# Patient Record
Sex: Female | Born: 1975
Health system: Southern US, Community
[De-identification: ages and names within clinical notes are randomized; demographics above are authoritative.]

## PROBLEM LIST (undated history)

## (undated) ENCOUNTER — Inpatient Hospital Stay (HOSPITAL_COMMUNITY): Payer: Self-pay

## (undated) DIAGNOSIS — I10 Essential (primary) hypertension: Secondary | ICD-10-CM

## (undated) DIAGNOSIS — D6859 Other primary thrombophilia: Secondary | ICD-10-CM

## (undated) HISTORY — PX: NO PAST SURGERIES: SHX2092

---

## 1997-07-28 ENCOUNTER — Other Ambulatory Visit: Admission: RE | Admit: 1997-07-28 | Discharge: 1997-07-28 | Payer: Self-pay | Admitting: Obstetrics & Gynecology

## 1997-11-14 ENCOUNTER — Ambulatory Visit (HOSPITAL_COMMUNITY): Admission: RE | Admit: 1997-11-14 | Discharge: 1997-11-14 | Payer: Self-pay | Admitting: Obstetrics & Gynecology

## 1997-12-23 ENCOUNTER — Ambulatory Visit (HOSPITAL_COMMUNITY): Admission: RE | Admit: 1997-12-23 | Discharge: 1997-12-23 | Payer: Self-pay | Admitting: Obstetrics & Gynecology

## 1998-02-24 ENCOUNTER — Inpatient Hospital Stay (HOSPITAL_COMMUNITY): Admission: AD | Admit: 1998-02-24 | Discharge: 1998-02-26 | Payer: Self-pay

## 1998-10-11 ENCOUNTER — Inpatient Hospital Stay (HOSPITAL_COMMUNITY): Admission: AD | Admit: 1998-10-11 | Discharge: 1998-10-11 | Payer: Self-pay | Admitting: *Deleted

## 1998-10-19 ENCOUNTER — Other Ambulatory Visit: Admission: RE | Admit: 1998-10-19 | Discharge: 1998-10-19 | Payer: Self-pay | Admitting: *Deleted

## 1999-04-01 ENCOUNTER — Inpatient Hospital Stay (HOSPITAL_COMMUNITY): Admission: AD | Admit: 1999-04-01 | Discharge: 1999-04-01 | Payer: Self-pay | Admitting: Obstetrics and Gynecology

## 1999-04-09 ENCOUNTER — Inpatient Hospital Stay (HOSPITAL_COMMUNITY): Admission: AD | Admit: 1999-04-09 | Discharge: 1999-04-11 | Payer: Self-pay | Admitting: *Deleted

## 1999-08-16 ENCOUNTER — Emergency Department (HOSPITAL_COMMUNITY): Admission: EM | Admit: 1999-08-16 | Discharge: 1999-08-16 | Payer: Self-pay | Admitting: Emergency Medicine

## 1999-08-17 ENCOUNTER — Encounter: Payer: Self-pay | Admitting: Emergency Medicine

## 2007-10-20 ENCOUNTER — Encounter: Admission: RE | Admit: 2007-10-20 | Discharge: 2007-12-15 | Payer: Self-pay | Admitting: Obstetrics

## 2007-12-01 ENCOUNTER — Ambulatory Visit (HOSPITAL_COMMUNITY): Admission: RE | Admit: 2007-12-01 | Discharge: 2007-12-01 | Payer: Self-pay | Admitting: Obstetrics

## 2007-12-31 ENCOUNTER — Ambulatory Visit (HOSPITAL_COMMUNITY): Admission: RE | Admit: 2007-12-31 | Discharge: 2007-12-31 | Payer: Self-pay | Admitting: Obstetrics

## 2008-01-28 ENCOUNTER — Ambulatory Visit (HOSPITAL_COMMUNITY): Admission: RE | Admit: 2008-01-28 | Discharge: 2008-01-28 | Payer: Self-pay | Admitting: Obstetrics

## 2008-02-11 ENCOUNTER — Ambulatory Visit (HOSPITAL_COMMUNITY): Admission: RE | Admit: 2008-02-11 | Discharge: 2008-02-11 | Payer: Self-pay | Admitting: Obstetrics

## 2008-02-24 ENCOUNTER — Ambulatory Visit (HOSPITAL_COMMUNITY): Admission: RE | Admit: 2008-02-24 | Discharge: 2008-02-24 | Payer: Self-pay | Admitting: Obstetrics

## 2008-03-02 ENCOUNTER — Encounter: Admission: RE | Admit: 2008-03-02 | Discharge: 2008-03-14 | Payer: Self-pay | Admitting: Obstetrics

## 2008-03-04 ENCOUNTER — Inpatient Hospital Stay (HOSPITAL_COMMUNITY): Admission: AD | Admit: 2008-03-04 | Discharge: 2008-03-04 | Payer: Self-pay | Admitting: Obstetrics

## 2008-03-16 ENCOUNTER — Ambulatory Visit (HOSPITAL_COMMUNITY): Admission: RE | Admit: 2008-03-16 | Discharge: 2008-03-16 | Payer: Self-pay | Admitting: Obstetrics

## 2008-04-06 ENCOUNTER — Ambulatory Visit (HOSPITAL_COMMUNITY): Admission: RE | Admit: 2008-04-06 | Discharge: 2008-04-06 | Payer: Self-pay | Admitting: Obstetrics

## 2008-04-09 ENCOUNTER — Inpatient Hospital Stay (HOSPITAL_COMMUNITY): Admission: AD | Admit: 2008-04-09 | Discharge: 2008-04-09 | Payer: Self-pay | Admitting: Obstetrics and Gynecology

## 2008-04-12 ENCOUNTER — Inpatient Hospital Stay (HOSPITAL_COMMUNITY): Admission: AD | Admit: 2008-04-12 | Discharge: 2008-04-12 | Payer: Self-pay | Admitting: Obstetrics and Gynecology

## 2008-04-21 ENCOUNTER — Inpatient Hospital Stay (HOSPITAL_COMMUNITY): Admission: AD | Admit: 2008-04-21 | Discharge: 2008-04-21 | Payer: Self-pay | Admitting: Obstetrics and Gynecology

## 2008-04-29 ENCOUNTER — Inpatient Hospital Stay (HOSPITAL_COMMUNITY): Admission: AD | Admit: 2008-04-29 | Discharge: 2008-05-02 | Payer: Self-pay | Admitting: Obstetrics & Gynecology

## 2008-05-12 ENCOUNTER — Encounter: Admission: RE | Admit: 2008-05-12 | Discharge: 2008-06-02 | Payer: Self-pay | Admitting: Certified Nurse Midwife

## 2010-02-26 ENCOUNTER — Ambulatory Visit (HOSPITAL_COMMUNITY)
Admission: RE | Admit: 2010-02-26 | Discharge: 2010-02-26 | Payer: Self-pay | Source: Home / Self Care | Admitting: Obstetrics and Gynecology

## 2010-04-30 ENCOUNTER — Ambulatory Visit: Payer: 59 | Attending: Obstetrics and Gynecology | Admitting: Physical Therapy

## 2010-04-30 DIAGNOSIS — M25569 Pain in unspecified knee: Secondary | ICD-10-CM | POA: Insufficient documentation

## 2010-04-30 DIAGNOSIS — IMO0001 Reserved for inherently not codable concepts without codable children: Secondary | ICD-10-CM | POA: Insufficient documentation

## 2010-04-30 DIAGNOSIS — M25669 Stiffness of unspecified knee, not elsewhere classified: Secondary | ICD-10-CM | POA: Insufficient documentation

## 2010-05-02 ENCOUNTER — Ambulatory Visit: Payer: 59 | Admitting: Physical Therapy

## 2010-05-10 ENCOUNTER — Ambulatory Visit: Payer: 59 | Admitting: Rehabilitative and Restorative Service Providers"

## 2010-05-16 ENCOUNTER — Encounter: Payer: 59 | Admitting: Rehabilitative and Restorative Service Providers"

## 2010-06-13 ENCOUNTER — Telehealth: Payer: Self-pay | Admitting: *Deleted

## 2010-06-18 ENCOUNTER — Ambulatory Visit (HOSPITAL_COMMUNITY): Payer: 59 | Attending: Obstetrics & Gynecology | Admitting: Radiology

## 2010-06-18 DIAGNOSIS — R0602 Shortness of breath: Secondary | ICD-10-CM | POA: Insufficient documentation

## 2010-06-18 DIAGNOSIS — O99891 Other specified diseases and conditions complicating pregnancy: Secondary | ICD-10-CM | POA: Insufficient documentation

## 2010-06-18 DIAGNOSIS — R609 Edema, unspecified: Secondary | ICD-10-CM | POA: Insufficient documentation

## 2010-06-18 DIAGNOSIS — I428 Other cardiomyopathies: Secondary | ICD-10-CM

## 2010-06-20 ENCOUNTER — Inpatient Hospital Stay (HOSPITAL_COMMUNITY)
Admission: AD | Admit: 2010-06-20 | Discharge: 2010-06-25 | DRG: 774 | Disposition: A | Discharge status: Home / Self Care | Payer: 59 | Source: Ambulatory Visit | Attending: Obstetrics and Gynecology | Admitting: Obstetrics and Gynecology

## 2010-06-20 DIAGNOSIS — O151 Eclampsia in labor: Principal | ICD-10-CM | POA: Diagnosis present

## 2010-06-20 LAB — COMPREHENSIVE METABOLIC PANEL
ALT: 18 U/L (ref 0–35)
AST: 24 U/L (ref 0–37)
Albumin: 2.4 g/dL — ABNORMAL LOW (ref 3.5–5.2)
Alkaline Phosphatase: 90 U/L (ref 39–117)
BUN: 5 mg/dL — ABNORMAL LOW (ref 6–23)
CO2: 22 mEq/L (ref 19–32)
Calcium: 8.4 mg/dL (ref 8.4–10.5)
Chloride: 107 mEq/L (ref 96–112)
Creatinine, Ser: 0.56 mg/dL (ref 0.4–1.2)
GFR calc Af Amer: >60 mL/min (ref >60)
GFR calc non Af Amer: >60 mL/min (ref >60)
Glucose, Bld: 107 mg/dL — ABNORMAL HIGH (ref 70–99)
Potassium: 3.5 mEq/L (ref 3.5–5.1)
Sodium: 138 mEq/L (ref 135–145)
Total Bilirubin: 0.3 mg/dL (ref 0.3–1.2)
Total Protein: 5.7 g/dL — ABNORMAL LOW (ref 6.0–8.3)

## 2010-06-20 LAB — CBC
HCT: 34.5 % — ABNORMAL LOW (ref 36.0–46.0)
HCT: 34.6 % — ABNORMAL LOW (ref 36.0–46.0)
Hemoglobin: 11.2 g/dL — ABNORMAL LOW (ref 12.0–15.0)
Hemoglobin: 11.5 g/dL — ABNORMAL LOW (ref 12.0–15.0)
MCH: 27.1 pg (ref 26.0–34.0)
MCH: 27.7 pg (ref 26.0–34.0)
MCHC: 32.4 g/dL (ref 30.0–36.0)
MCHC: 33.3 g/dL (ref 30.0–36.0)
MCV: 83.1 fL (ref 78.0–100.0)
MCV: 83.8 fL (ref 78.0–100.0)
Platelets: 326 10*3/uL (ref 150–400)
Platelets: 327 10*3/uL (ref 150–400)
RBC: 4.13 MIL/uL (ref 3.87–5.11)
RBC: 4.15 MIL/uL (ref 3.87–5.11)
RDW: 14.3 % (ref 11.5–15.5)
RDW: 14.7 % (ref 11.5–15.5)
WBC: 16.5 10*3/uL — ABNORMAL HIGH (ref 4.0–10.5)
WBC: 9.2 10*3/uL (ref 4.0–10.5)

## 2010-06-20 LAB — URIC ACID: Uric Acid, Serum: 7.7 mg/dL — ABNORMAL HIGH (ref 2.4–7.0)

## 2010-06-20 LAB — RPR: RPR Ser Ql: NONREACTIVE

## 2010-06-20 LAB — LACTATE DEHYDROGENASE: LDH: 187 U/L (ref 94–250)

## 2010-06-20 LAB — MAGNESIUM: Magnesium: 3.7 mg/dL — ABNORMAL HIGH (ref 1.5–2.5)

## 2010-06-21 LAB — COMPREHENSIVE METABOLIC PANEL
ALT: 18 U/L (ref 0–35)
AST: 29 U/L (ref 0–37)
Albumin: 2.1 g/dL — ABNORMAL LOW (ref 3.5–5.2)
Alkaline Phosphatase: 81 U/L (ref 39–117)
BUN: 3 mg/dL — ABNORMAL LOW (ref 6–23)
CO2: 24 mEq/L (ref 19–32)
Calcium: 7.6 mg/dL — ABNORMAL LOW (ref 8.4–10.5)
Chloride: 105 mEq/L (ref 96–112)
Creatinine, Ser: 0.62 mg/dL (ref 0.4–1.2)
GFR calc Af Amer: >60 mL/min (ref >60)
GFR calc non Af Amer: >60 mL/min (ref >60)
Glucose, Bld: 127 mg/dL — ABNORMAL HIGH (ref 70–99)
Potassium: 2.8 mEq/L — ABNORMAL LOW (ref 3.5–5.1)
Sodium: 137 mEq/L (ref 135–145)
Total Bilirubin: 0.5 mg/dL (ref 0.3–1.2)
Total Protein: 5.8 g/dL — ABNORMAL LOW (ref 6.0–8.3)

## 2010-06-21 LAB — CBC
HCT: 33 % — ABNORMAL LOW (ref 36.0–46.0)
Hemoglobin: 10.7 g/dL — ABNORMAL LOW (ref 12.0–15.0)
MCH: 27.2 pg (ref 26.0–34.0)
MCHC: 32.4 g/dL (ref 30.0–36.0)
MCV: 84 fL (ref 78.0–100.0)
Platelets: 314 10*3/uL (ref 150–400)
RBC: 3.93 MIL/uL (ref 3.87–5.11)
RDW: 14.7 % (ref 11.5–15.5)
WBC: 20.4 10*3/uL — ABNORMAL HIGH (ref 4.0–10.5)

## 2010-06-21 LAB — URIC ACID: Uric Acid, Serum: 8.2 mg/dL — ABNORMAL HIGH (ref 2.4–7.0)

## 2010-06-21 LAB — LACTATE DEHYDROGENASE: LDH: 219 U/L (ref 94–250)

## 2010-06-21 LAB — MRSA PCR SCREENING

## 2010-06-21 LAB — MAGNESIUM: Magnesium: 4.9 mg/dL — ABNORMAL HIGH (ref 1.5–2.5)

## 2010-06-22 LAB — MAGNESIUM: Magnesium: 4.7 mg/dL — ABNORMAL HIGH (ref 1.5–2.5)

## 2010-06-22 LAB — COMPREHENSIVE METABOLIC PANEL
ALT: 18 U/L (ref 0–35)
AST: 28 U/L (ref 0–37)
Albumin: 2 g/dL — ABNORMAL LOW (ref 3.5–5.2)
Alkaline Phosphatase: 68 U/L (ref 39–117)
BUN: 3 mg/dL — ABNORMAL LOW (ref 6–23)
CO2: 25 mEq/L (ref 19–32)
Calcium: 7 mg/dL — ABNORMAL LOW (ref 8.4–10.5)
Chloride: 109 mEq/L (ref 96–112)
Creatinine, Ser: 0.6 mg/dL (ref 0.4–1.2)
GFR calc Af Amer: >60 mL/min (ref >60)
GFR calc non Af Amer: >60 mL/min (ref >60)
Glucose, Bld: 115 mg/dL — ABNORMAL HIGH (ref 70–99)
Potassium: 3.1 mEq/L — ABNORMAL LOW (ref 3.5–5.1)
Sodium: 140 mEq/L (ref 135–145)
Total Bilirubin: 0.4 mg/dL (ref 0.3–1.2)
Total Protein: 5.4 g/dL — ABNORMAL LOW (ref 6.0–8.3)

## 2010-06-22 LAB — DIFFERENTIAL
Basophils Absolute: 0 10*3/uL (ref 0.0–0.1)
Basophils Relative: 0 % (ref 0–1)
Eosinophils Absolute: 0.3 10*3/uL (ref 0.0–0.7)
Eosinophils Relative: 2 % (ref 0–5)
Lymphocytes Relative: 28 % (ref 12–46)
Lymphs Abs: 3.8 10*3/uL (ref 0.7–4.0)
Monocytes Absolute: 1.5 10*3/uL — ABNORMAL HIGH (ref 0.1–1.0)
Monocytes Relative: 11 % (ref 3–12)
Neutro Abs: 8.1 10*3/uL — ABNORMAL HIGH (ref 1.7–7.7)
Neutrophils Relative %: 59 % (ref 43–77)

## 2010-06-22 LAB — CBC
HCT: 31.2 % — ABNORMAL LOW (ref 36.0–46.0)
Hemoglobin: 9.7 g/dL — ABNORMAL LOW (ref 12.0–15.0)
MCH: 26.5 pg (ref 26.0–34.0)
MCHC: 31.1 g/dL (ref 30.0–36.0)
MCV: 85.2 fL (ref 78.0–100.0)
Platelets: 308 10*3/uL (ref 150–400)
RBC: 3.66 MIL/uL — ABNORMAL LOW (ref 3.87–5.11)
RDW: 15.1 % (ref 11.5–15.5)
WBC: 13.7 10*3/uL — ABNORMAL HIGH (ref 4.0–10.5)

## 2010-06-22 LAB — T4, FREE: Free T4: 0.97 ng/dL (ref 0.80–1.80)

## 2010-06-22 LAB — TSH: TSH: 2.917 u[IU]/mL (ref 0.350–4.500)

## 2010-06-23 LAB — COMPREHENSIVE METABOLIC PANEL
ALT: 42 U/L — ABNORMAL HIGH (ref 0–35)
AST: 63 U/L — ABNORMAL HIGH (ref 0–37)
Albumin: 2.3 g/dL — ABNORMAL LOW (ref 3.5–5.2)
Alkaline Phosphatase: 77 U/L (ref 39–117)
BUN: 2 mg/dL — ABNORMAL LOW (ref 6–23)
CO2: 26 mEq/L (ref 19–32)
Calcium: 7.9 mg/dL — ABNORMAL LOW (ref 8.4–10.5)
Chloride: 103 mEq/L (ref 96–112)
Creatinine, Ser: 0.59 mg/dL (ref 0.4–1.2)
GFR calc Af Amer: >60 mL/min (ref >60)
GFR calc non Af Amer: >60 mL/min (ref >60)
Glucose, Bld: 77 mg/dL (ref 70–99)
Potassium: 3.6 mEq/L (ref 3.5–5.1)
Sodium: 139 mEq/L (ref 135–145)
Total Bilirubin: 0.2 mg/dL — ABNORMAL LOW (ref 0.3–1.2)
Total Protein: 5.2 g/dL — ABNORMAL LOW (ref 6.0–8.3)

## 2010-06-23 LAB — MRSA CULTURE

## 2010-06-24 LAB — COMPREHENSIVE METABOLIC PANEL
ALT: 52 U/L — ABNORMAL HIGH (ref 0–35)
AST: 64 U/L — ABNORMAL HIGH (ref 0–37)
Albumin: 2.2 g/dL — ABNORMAL LOW (ref 3.5–5.2)
Alkaline Phosphatase: 71 U/L (ref 39–117)
BUN: 3 mg/dL — ABNORMAL LOW (ref 6–23)
CO2: 26 mEq/L (ref 19–32)
Calcium: 7.9 mg/dL — ABNORMAL LOW (ref 8.4–10.5)
Chloride: 105 mEq/L (ref 96–112)
Creatinine, Ser: 0.59 mg/dL (ref 0.4–1.2)
GFR calc Af Amer: >60 mL/min (ref >60)
GFR calc non Af Amer: >60 mL/min (ref >60)
Glucose, Bld: 85 mg/dL (ref 70–99)
Potassium: 3.4 mEq/L — ABNORMAL LOW (ref 3.5–5.1)
Sodium: 139 mEq/L (ref 135–145)
Total Bilirubin: 0.6 mg/dL (ref 0.3–1.2)
Total Protein: 5.8 g/dL — ABNORMAL LOW (ref 6.0–8.3)

## 2010-06-24 LAB — CBC
HCT: 31.8 % — ABNORMAL LOW (ref 36.0–46.0)
Hemoglobin: 10 g/dL — ABNORMAL LOW (ref 12.0–15.0)
MCH: 26.9 pg (ref 26.0–34.0)
MCHC: 31.4 g/dL (ref 30.0–36.0)
MCV: 85.5 fL (ref 78.0–100.0)
Platelets: 343 10*3/uL (ref 150–400)
RBC: 3.72 MIL/uL — ABNORMAL LOW (ref 3.87–5.11)
RDW: 15.2 % (ref 11.5–15.5)
WBC: 9.5 10*3/uL (ref 4.0–10.5)

## 2010-06-25 LAB — COMPREHENSIVE METABOLIC PANEL
ALT: 49 U/L — ABNORMAL HIGH (ref 0–35)
AST: 42 U/L — ABNORMAL HIGH (ref 0–37)
Albumin: 2.5 g/dL — ABNORMAL LOW (ref 3.5–5.2)
Alkaline Phosphatase: 77 U/L (ref 39–117)
BUN: 3 mg/dL — ABNORMAL LOW (ref 6–23)
CO2: 25 mEq/L (ref 19–32)
Calcium: 8.6 mg/dL (ref 8.4–10.5)
Chloride: 108 mEq/L (ref 96–112)
Creatinine, Ser: 0.5 mg/dL (ref 0.4–1.2)
GFR calc Af Amer: >60 mL/min (ref >60)
GFR calc non Af Amer: >60 mL/min (ref >60)
Glucose, Bld: 99 mg/dL (ref 70–99)
Potassium: 3.6 mEq/L (ref 3.5–5.1)
Sodium: 139 mEq/L (ref 135–145)
Total Bilirubin: 0.7 mg/dL (ref 0.3–1.2)
Total Protein: 6.4 g/dL (ref 6.0–8.3)

## 2010-06-25 LAB — CBC
HCT: 33.6 % — ABNORMAL LOW (ref 36.0–46.0)
Hemoglobin: 10.7 g/dL — ABNORMAL LOW (ref 12.0–15.0)
MCH: 27.2 pg (ref 26.0–34.0)
MCHC: 31.8 g/dL (ref 30.0–36.0)
MCV: 85.5 fL (ref 78.0–100.0)
Platelets: 360 10*3/uL (ref 150–400)
RBC: 3.93 MIL/uL (ref 3.87–5.11)
RDW: 15.2 % (ref 11.5–15.5)
WBC: 9 10*3/uL (ref 4.0–10.5)

## 2010-06-26 ENCOUNTER — Encounter (HOSPITAL_COMMUNITY)
Admission: RE | Admit: 2010-06-26 | Discharge: 2010-06-26 | Disposition: A | Discharge status: Home / Self Care | Payer: 59 | Source: Ambulatory Visit | Attending: Obstetrics and Gynecology | Admitting: Obstetrics and Gynecology

## 2010-06-26 DIAGNOSIS — O923 Agalactia: Secondary | ICD-10-CM | POA: Insufficient documentation

## 2010-06-29 NOTE — Telephone Encounter (Signed)
Pt had echo 06/18/10

## 2010-07-09 LAB — BASIC METABOLIC PANEL
BUN: 3 mg/dL — ABNORMAL LOW (ref 6–23)
CO2: 23 mEq/L (ref 19–32)
Calcium: 8.6 mg/dL (ref 8.4–10.5)
Chloride: 108 mEq/L (ref 96–112)
Creatinine, Ser: 0.38 mg/dL — ABNORMAL LOW (ref 0.4–1.2)
GFR calc Af Amer: >60 mL/min (ref >60)
GFR calc non Af Amer: >60 mL/min (ref >60)
Glucose, Bld: 81 mg/dL (ref 70–99)
Potassium: 3.1 mEq/L — ABNORMAL LOW (ref 3.5–5.1)
Sodium: 137 mEq/L (ref 135–145)

## 2010-07-09 LAB — GLUCOSE, CAPILLARY
Glucose-Capillary: 79 mg/dL (ref 70–99)
Glucose-Capillary: 80 mg/dL (ref 70–99)

## 2010-07-10 LAB — GLUCOSE, CAPILLARY
Glucose-Capillary: 106 mg/dL — ABNORMAL HIGH (ref 70–99)
Glucose-Capillary: 109 mg/dL — ABNORMAL HIGH (ref 70–99)
Glucose-Capillary: 117 mg/dL — ABNORMAL HIGH (ref 70–99)
Glucose-Capillary: 72 mg/dL (ref 70–99)
Glucose-Capillary: 76 mg/dL (ref 70–99)
Glucose-Capillary: 77 mg/dL (ref 70–99)
Glucose-Capillary: 81 mg/dL (ref 70–99)
Glucose-Capillary: 95 mg/dL (ref 70–99)

## 2010-07-10 LAB — CBC
HCT: 23.5 % — ABNORMAL LOW (ref 36.0–46.0)
HCT: 26.8 % — ABNORMAL LOW (ref 36.0–46.0)
HCT: 32.8 % — ABNORMAL LOW (ref 36.0–46.0)
Hemoglobin: 10.7 g/dL — ABNORMAL LOW (ref 12.0–15.0)
Hemoglobin: 7.7 g/dL — CL (ref 12.0–15.0)
Hemoglobin: 8.8 g/dL — ABNORMAL LOW (ref 12.0–15.0)
MCHC: 32.5 g/dL (ref 30.0–36.0)
MCHC: 32.8 g/dL (ref 30.0–36.0)
MCHC: 32.9 g/dL (ref 30.0–36.0)
MCV: 86.6 fL (ref 78.0–100.0)
MCV: 87 fL (ref 78.0–100.0)
MCV: 87 fL (ref 78.0–100.0)
Platelets: 247 10*3/uL (ref 150–400)
Platelets: 263 10*3/uL (ref 150–400)
Platelets: 295 10*3/uL (ref 150–400)
RBC: 2.7 MIL/uL — ABNORMAL LOW (ref 3.87–5.11)
RBC: 3.08 MIL/uL — ABNORMAL LOW (ref 3.87–5.11)
RBC: 3.79 MIL/uL — ABNORMAL LOW (ref 3.87–5.11)
RDW: 14 % (ref 11.5–15.5)
RDW: 14.2 % (ref 11.5–15.5)
RDW: 14.5 % (ref 11.5–15.5)
WBC: 13.5 10*3/uL — ABNORMAL HIGH (ref 4.0–10.5)
WBC: 15.6 10*3/uL — ABNORMAL HIGH (ref 4.0–10.5)
WBC: 9.8 10*3/uL (ref 4.0–10.5)

## 2010-07-10 LAB — RPR: RPR Ser Ql: NONREACTIVE

## 2010-07-18 ENCOUNTER — Inpatient Hospital Stay (HOSPITAL_COMMUNITY)
Admission: AD | Admit: 2010-07-18 | Discharge: 2010-07-18 | Payer: Self-pay | Attending: Obstetrics and Gynecology | Admitting: Obstetrics and Gynecology

## 2010-07-26 ENCOUNTER — Encounter (HOSPITAL_COMMUNITY)
Admission: RE | Admit: 2010-07-26 | Discharge: 2010-07-26 | Disposition: A | Discharge status: Home / Self Care | Payer: 59 | Source: Ambulatory Visit | Attending: Obstetrics and Gynecology | Admitting: Obstetrics and Gynecology

## 2010-07-26 DIAGNOSIS — O923 Agalactia: Secondary | ICD-10-CM | POA: Insufficient documentation

## 2010-08-07 ENCOUNTER — Ambulatory Visit: Payer: 59

## 2010-08-07 ENCOUNTER — Other Ambulatory Visit: Payer: Self-pay | Admitting: Pediatrics

## 2010-08-07 DIAGNOSIS — R109 Unspecified abdominal pain: Secondary | ICD-10-CM

## 2010-08-09 IMAGING — US US OB FOLLOW-UP
1 series · 14 of 28 positions shown · non-contrast
Comparison: none

OBSTETRICAL ULTRASOUND:
 This ultrasound was performed in The [HOSPITAL], and the [facility] report will be stored to [REDACTED] PACS.

[Series 1: us ob follow-up · 14 of 43 slices shown]
[im 2/43]
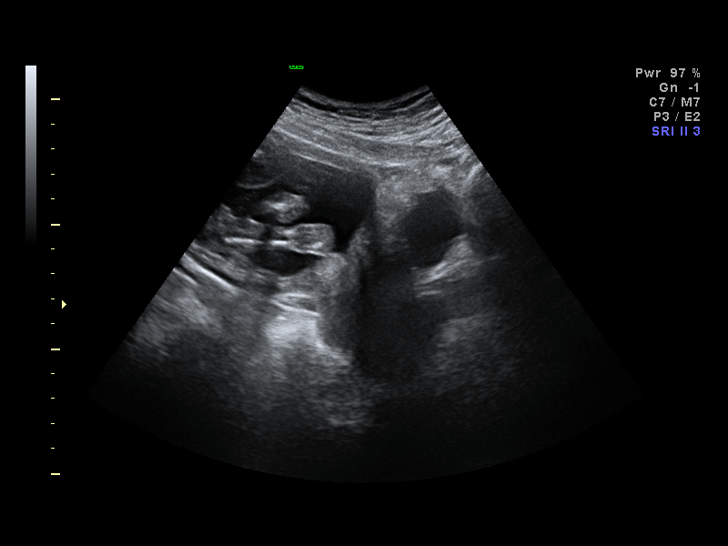
[im 5/43]
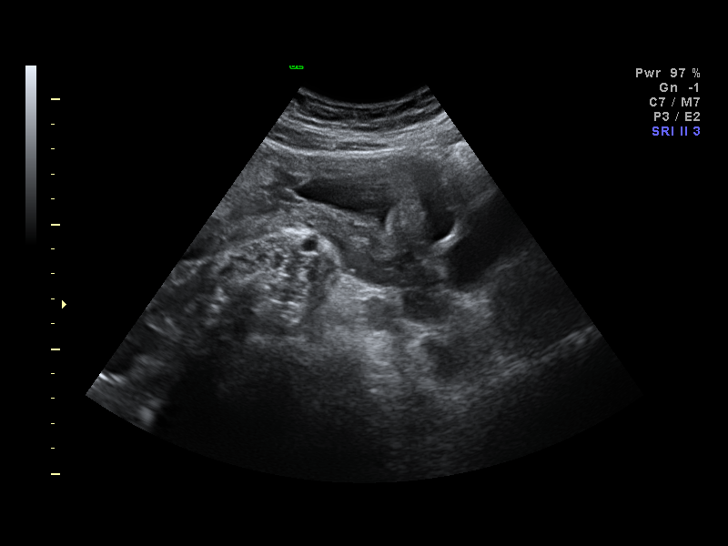
[im 8/43]
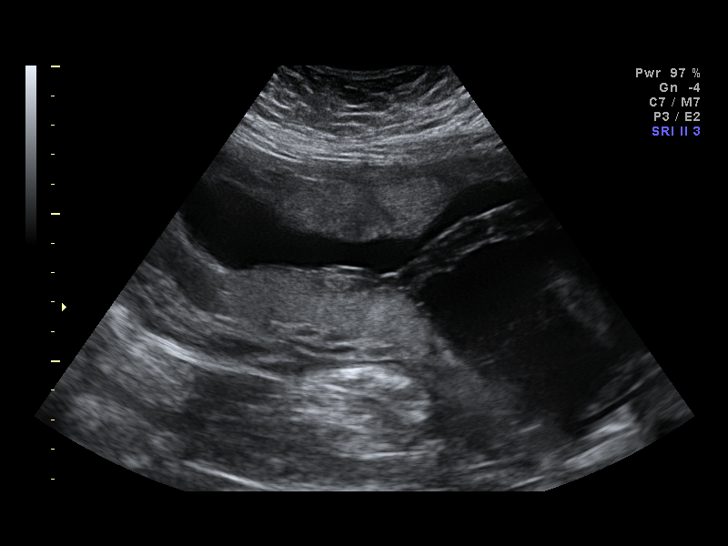
[im 11/43]
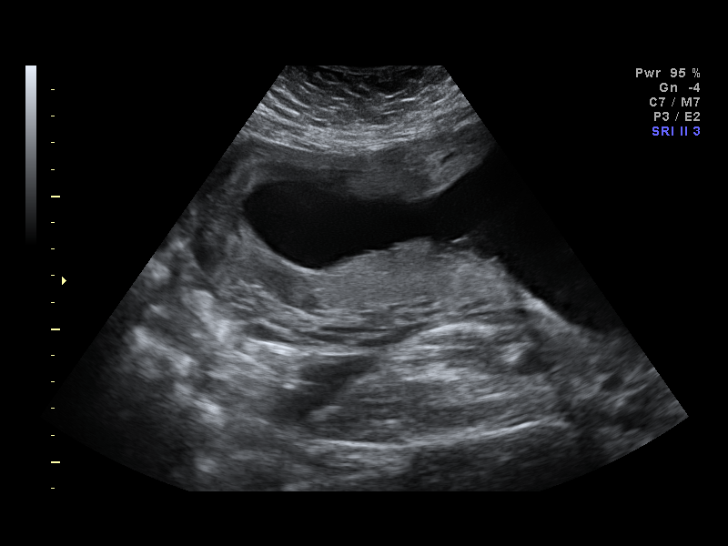
[im 15/43]
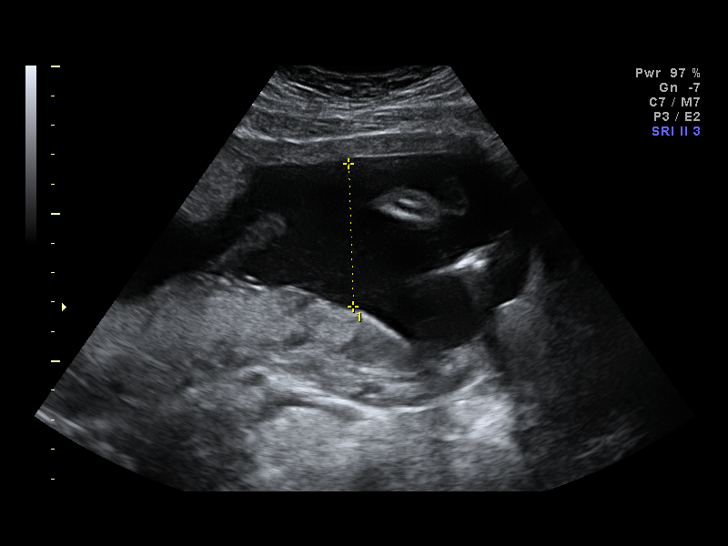
[im 18/43]
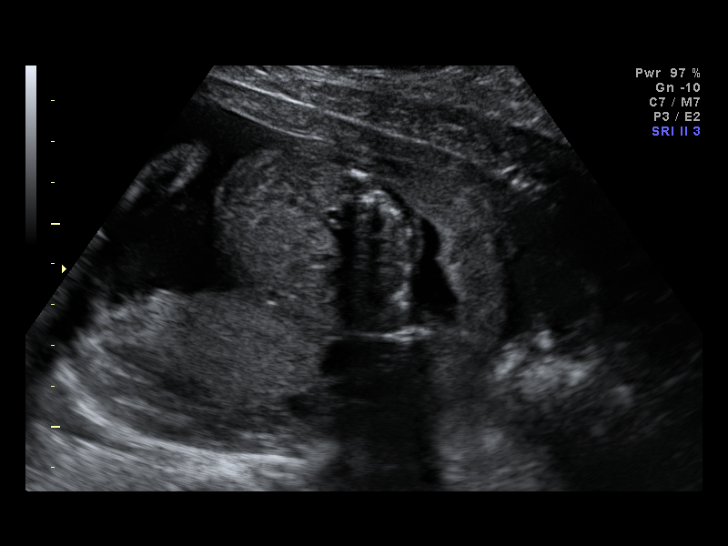
[im 21/43]
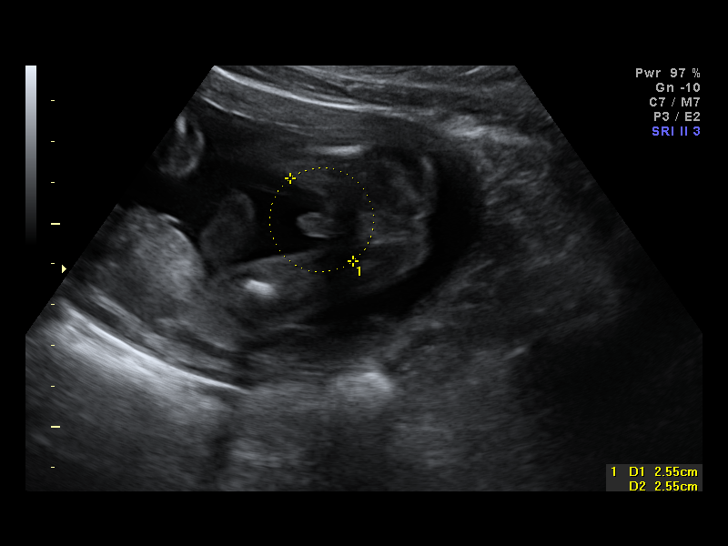
[im 24/43]
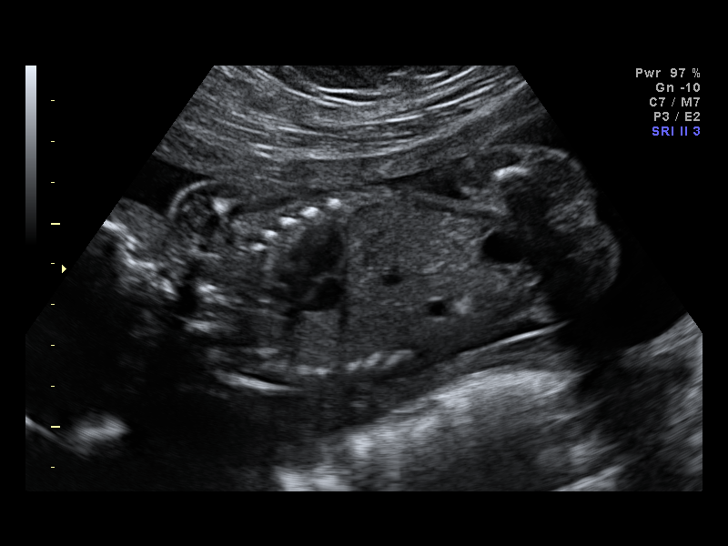
[im 27/43]
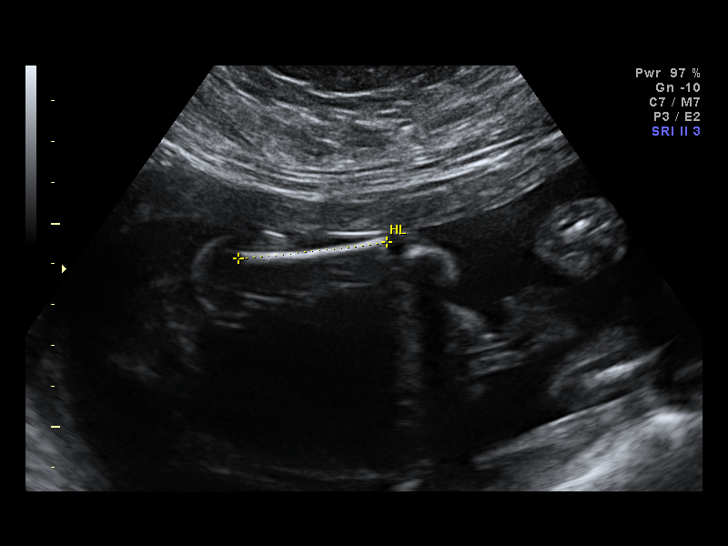
[im 30/43]
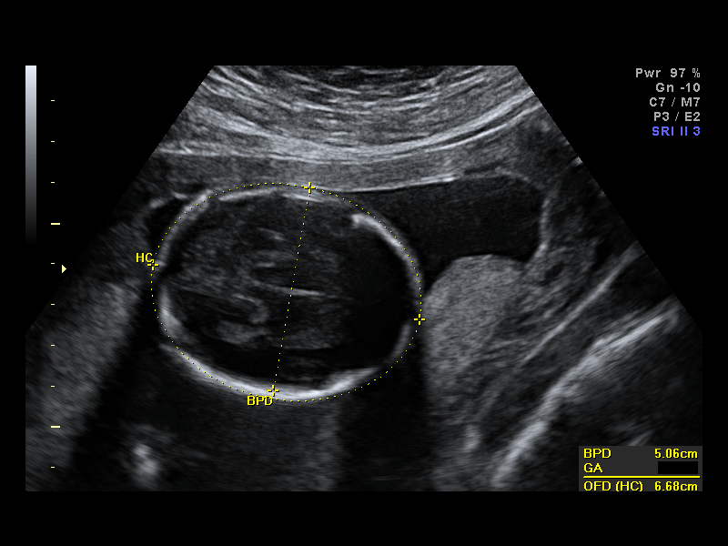
[im 33/43]
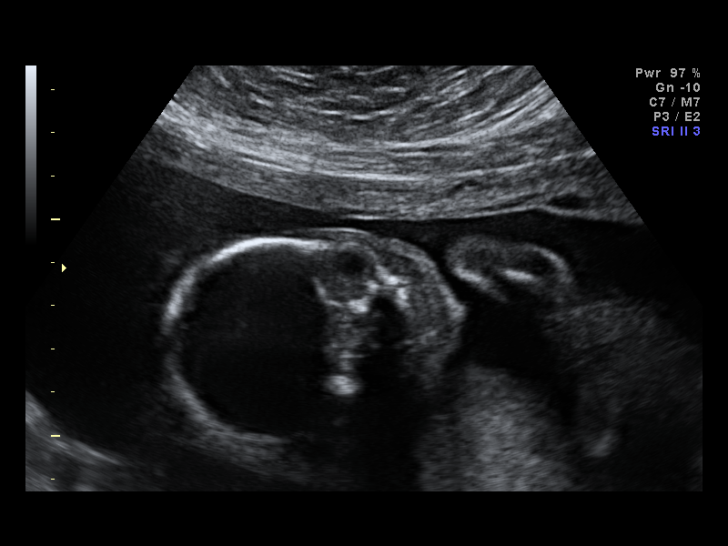
[im 36/43]
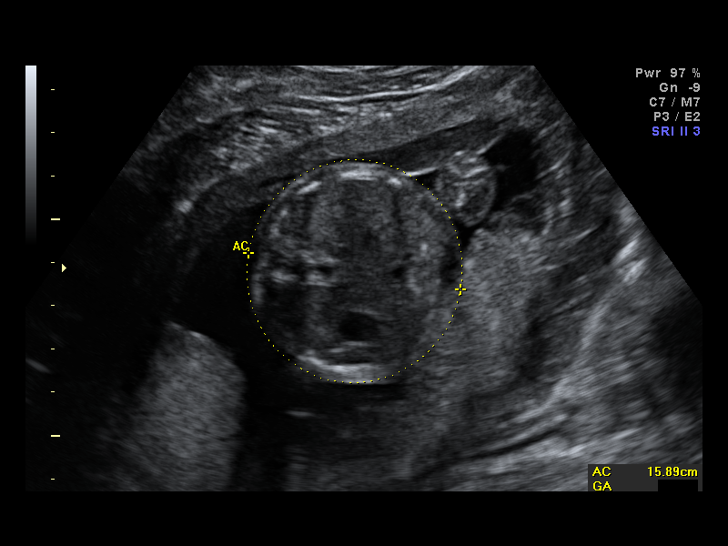
[im 39/43]
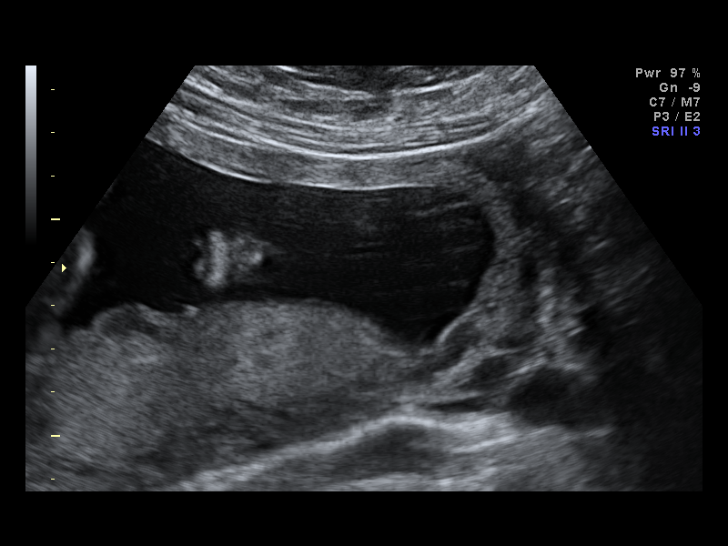
[im 43/43]
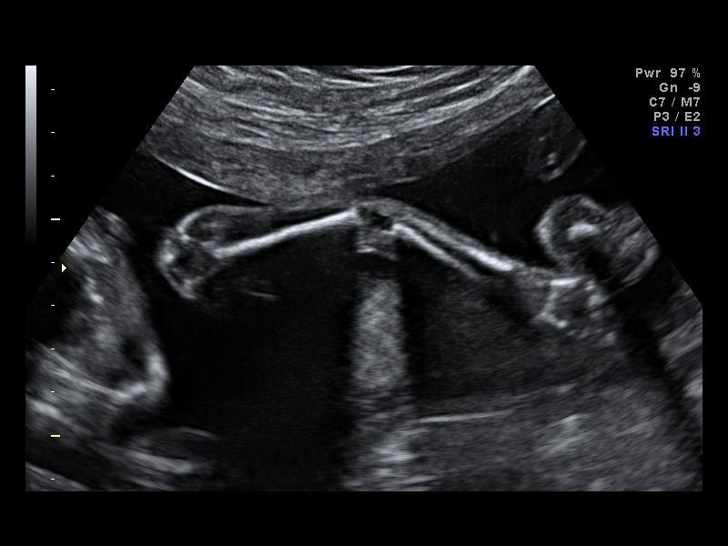

[14 of 28 positions shown; findings below may reference images not displayed]

IMPRESSION: [facility] has also been faxed to the ordering physician.
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
## 2010-08-26 ENCOUNTER — Encounter (HOSPITAL_COMMUNITY)
Admission: RE | Admit: 2010-08-26 | Discharge: 2010-08-26 | Disposition: A | Discharge status: Home / Self Care | Payer: 59 | Source: Ambulatory Visit | Attending: Obstetrics and Gynecology | Admitting: Obstetrics and Gynecology

## 2010-08-26 DIAGNOSIS — O923 Agalactia: Secondary | ICD-10-CM | POA: Insufficient documentation

## 2010-09-27 ENCOUNTER — Encounter (HOSPITAL_COMMUNITY)
Admission: RE | Admit: 2010-09-27 | Discharge: 2010-09-27 | Disposition: A | Discharge status: Home / Self Care | Payer: 59 | Source: Ambulatory Visit | Attending: Obstetrics and Gynecology | Admitting: Obstetrics and Gynecology

## 2010-09-27 DIAGNOSIS — O923 Agalactia: Secondary | ICD-10-CM | POA: Insufficient documentation

## 2010-10-24 NOTE — Telephone Encounter (Signed)
I Believe this was sent to me by accident.

## 2010-10-28 ENCOUNTER — Encounter (HOSPITAL_COMMUNITY)
Admission: RE | Admit: 2010-10-28 | Discharge: 2010-10-28 | Disposition: A | Discharge status: Home / Self Care | Payer: 59 | Source: Ambulatory Visit | Attending: Obstetrics and Gynecology | Admitting: Obstetrics and Gynecology

## 2010-10-28 DIAGNOSIS — O923 Agalactia: Secondary | ICD-10-CM | POA: Insufficient documentation

## 2010-11-28 ENCOUNTER — Encounter (HOSPITAL_COMMUNITY)
Admission: RE | Admit: 2010-11-28 | Discharge: 2010-11-28 | Disposition: A | Discharge status: Home / Self Care | Payer: 59 | Source: Ambulatory Visit | Attending: Obstetrics and Gynecology | Admitting: Obstetrics and Gynecology

## 2010-11-28 DIAGNOSIS — O923 Agalactia: Secondary | ICD-10-CM | POA: Insufficient documentation

## 2010-12-29 ENCOUNTER — Encounter (HOSPITAL_COMMUNITY)
Admission: RE | Admit: 2010-12-29 | Discharge: 2010-12-29 | Disposition: A | Discharge status: Home / Self Care | Payer: 59 | Source: Ambulatory Visit | Attending: Obstetrics and Gynecology | Admitting: Obstetrics and Gynecology

## 2010-12-29 DIAGNOSIS — O923 Agalactia: Secondary | ICD-10-CM | POA: Insufficient documentation

## 2011-01-29 ENCOUNTER — Encounter (HOSPITAL_COMMUNITY)
Admission: RE | Admit: 2011-01-29 | Discharge: 2011-01-29 | Disposition: A | Discharge status: Home / Self Care | Payer: 59 | Source: Ambulatory Visit | Attending: Obstetrics and Gynecology | Admitting: Obstetrics and Gynecology

## 2011-01-29 DIAGNOSIS — O923 Agalactia: Secondary | ICD-10-CM | POA: Insufficient documentation

## 2011-03-01 ENCOUNTER — Encounter (HOSPITAL_COMMUNITY)
Admission: RE | Admit: 2011-03-01 | Discharge: 2011-03-01 | Disposition: A | Discharge status: Home / Self Care | Payer: 59 | Source: Ambulatory Visit | Attending: Obstetrics and Gynecology | Admitting: Obstetrics and Gynecology

## 2011-03-01 DIAGNOSIS — O923 Agalactia: Secondary | ICD-10-CM | POA: Insufficient documentation

## 2011-04-01 ENCOUNTER — Encounter (HOSPITAL_COMMUNITY)
Admission: RE | Admit: 2011-04-01 | Discharge: 2011-04-01 | Disposition: A | Discharge status: Home / Self Care | Payer: 59 | Source: Ambulatory Visit | Attending: Obstetrics and Gynecology | Admitting: Obstetrics and Gynecology

## 2011-04-01 DIAGNOSIS — O923 Agalactia: Secondary | ICD-10-CM | POA: Insufficient documentation

## 2011-05-02 ENCOUNTER — Encounter (HOSPITAL_COMMUNITY)
Admission: RE | Admit: 2011-05-02 | Discharge: 2011-05-02 | Disposition: A | Discharge status: Home / Self Care | Payer: 59 | Source: Ambulatory Visit | Attending: Obstetrics and Gynecology | Admitting: Obstetrics and Gynecology

## 2011-05-02 DIAGNOSIS — O923 Agalactia: Secondary | ICD-10-CM | POA: Insufficient documentation

## 2011-05-31 ENCOUNTER — Encounter (HOSPITAL_COMMUNITY)
Admission: RE | Admit: 2011-05-31 | Discharge: 2011-05-31 | Disposition: A | Discharge status: Home / Self Care | Payer: 59 | Source: Ambulatory Visit | Attending: Obstetrics and Gynecology | Admitting: Obstetrics and Gynecology

## 2011-05-31 DIAGNOSIS — O923 Agalactia: Secondary | ICD-10-CM | POA: Insufficient documentation

## 2011-07-01 ENCOUNTER — Encounter (HOSPITAL_COMMUNITY)
Admission: RE | Admit: 2011-07-01 | Discharge: 2011-07-01 | Disposition: A | Discharge status: Home / Self Care | Payer: 59 | Source: Ambulatory Visit | Attending: Obstetrics and Gynecology | Admitting: Obstetrics and Gynecology

## 2011-07-01 DIAGNOSIS — O923 Agalactia: Secondary | ICD-10-CM | POA: Insufficient documentation

## 2011-08-01 ENCOUNTER — Encounter (HOSPITAL_COMMUNITY)
Admission: RE | Admit: 2011-08-01 | Discharge: 2011-08-01 | Disposition: A | Discharge status: Home / Self Care | Payer: 59 | Source: Ambulatory Visit | Attending: Obstetrics and Gynecology | Admitting: Obstetrics and Gynecology

## 2011-08-01 DIAGNOSIS — O923 Agalactia: Secondary | ICD-10-CM | POA: Insufficient documentation

## 2011-09-01 ENCOUNTER — Encounter (HOSPITAL_COMMUNITY)
Admission: RE | Admit: 2011-09-01 | Discharge: 2011-09-01 | Disposition: A | Discharge status: Home / Self Care | Payer: 59 | Source: Ambulatory Visit | Attending: Obstetrics and Gynecology | Admitting: Obstetrics and Gynecology

## 2011-09-01 DIAGNOSIS — O923 Agalactia: Secondary | ICD-10-CM | POA: Insufficient documentation

## 2011-10-02 ENCOUNTER — Encounter (HOSPITAL_COMMUNITY)
Admission: RE | Admit: 2011-10-02 | Discharge: 2011-10-02 | Disposition: A | Discharge status: Home / Self Care | Payer: 59 | Source: Ambulatory Visit | Attending: Obstetrics and Gynecology | Admitting: Obstetrics and Gynecology

## 2011-10-02 DIAGNOSIS — O923 Agalactia: Secondary | ICD-10-CM | POA: Insufficient documentation

## 2011-11-02 ENCOUNTER — Encounter (HOSPITAL_COMMUNITY)
Admission: RE | Admit: 2011-11-02 | Discharge: 2011-11-02 | Disposition: A | Discharge status: Home / Self Care | Payer: 59 | Source: Ambulatory Visit | Attending: Obstetrics and Gynecology | Admitting: Obstetrics and Gynecology

## 2011-11-02 DIAGNOSIS — O923 Agalactia: Secondary | ICD-10-CM | POA: Insufficient documentation

## 2011-12-03 ENCOUNTER — Encounter (HOSPITAL_COMMUNITY)
Admission: RE | Admit: 2011-12-03 | Discharge: 2011-12-03 | Disposition: A | Discharge status: Home / Self Care | Payer: 59 | Source: Ambulatory Visit | Attending: Obstetrics and Gynecology | Admitting: Obstetrics and Gynecology

## 2011-12-03 DIAGNOSIS — O923 Agalactia: Secondary | ICD-10-CM | POA: Insufficient documentation

## 2012-01-03 ENCOUNTER — Encounter (HOSPITAL_COMMUNITY)
Admission: RE | Admit: 2012-01-03 | Discharge: 2012-01-03 | Disposition: A | Discharge status: Home / Self Care | Payer: 59 | Source: Ambulatory Visit | Attending: Obstetrics and Gynecology | Admitting: Obstetrics and Gynecology

## 2012-01-03 DIAGNOSIS — O923 Agalactia: Secondary | ICD-10-CM | POA: Insufficient documentation

## 2012-02-03 ENCOUNTER — Encounter (HOSPITAL_COMMUNITY)
Admission: RE | Admit: 2012-02-03 | Discharge: 2012-02-03 | Disposition: A | Discharge status: Home / Self Care | Payer: 59 | Source: Ambulatory Visit | Attending: Obstetrics and Gynecology | Admitting: Obstetrics and Gynecology

## 2012-02-03 DIAGNOSIS — O923 Agalactia: Secondary | ICD-10-CM | POA: Insufficient documentation

## 2012-08-07 ENCOUNTER — Other Ambulatory Visit: Payer: Self-pay | Admitting: Obstetrics and Gynecology

## 2012-08-07 ENCOUNTER — Other Ambulatory Visit (HOSPITAL_COMMUNITY)
Admission: RE | Admit: 2012-08-07 | Discharge: 2012-08-07 | Disposition: A | Discharge status: Home / Self Care | Payer: Managed Care, Other (non HMO) | Source: Ambulatory Visit | Attending: Obstetrics and Gynecology | Admitting: Obstetrics and Gynecology

## 2012-08-07 DIAGNOSIS — Z113 Encounter for screening for infections with a predominantly sexual mode of transmission: Secondary | ICD-10-CM | POA: Insufficient documentation

## 2012-08-07 DIAGNOSIS — Z01419 Encounter for gynecological examination (general) (routine) without abnormal findings: Secondary | ICD-10-CM | POA: Insufficient documentation

## 2012-08-07 DIAGNOSIS — Z1151 Encounter for screening for human papillomavirus (HPV): Secondary | ICD-10-CM | POA: Insufficient documentation

## 2012-10-26 ENCOUNTER — Encounter (HOSPITAL_COMMUNITY): Payer: Self-pay

## 2012-10-26 ENCOUNTER — Emergency Department (HOSPITAL_COMMUNITY)
Admission: EM | Admit: 2012-10-26 | Discharge: 2012-10-27 | Disposition: A | Discharge status: Home / Self Care | Payer: Managed Care, Other (non HMO) | Attending: Emergency Medicine | Admitting: Emergency Medicine

## 2012-10-26 DIAGNOSIS — M545 Low back pain, unspecified: Secondary | ICD-10-CM | POA: Insufficient documentation

## 2012-10-26 DIAGNOSIS — Z3202 Encounter for pregnancy test, result negative: Secondary | ICD-10-CM | POA: Insufficient documentation

## 2012-10-26 LAB — POCT PREGNANCY, URINE: Preg Test, Ur: NEGATIVE

## 2012-10-26 MED ORDER — HYDROCODONE-ACETAMINOPHEN 5-325 MG PO TABS
2.0000 | ORAL_TABLET | Freq: Once | ORAL | Status: AC
  Administered 2012-10-27: 2 via ORAL
  Filled 2012-10-26: qty 2

## 2012-10-26 NOTE — ED Notes (Signed)
Pt complains of acute onset of lower back pain yesterday while riding in the car, no injury noted

## 2012-10-26 NOTE — ED Provider Notes (Signed)
CSN: [id number]     Arrival date & time 10/26/12  2251 History  This chart was scribed for non-physician practitioner, Ivonne Andrew, PA-C working with Raeford Razor, MD by Greggory Stallion, ED scribe. This patient was seen in room WTR7/WTR7 and the patient's care was started at 11:24 PM.   Chief Complaint  Patient presents with  . Back Pain   The history is provided by the patient. No language interpreter was used.    HPI Comments: History provided by patient and significant other. Jean Garcia is a 37 y.o. female with no significant PMH who presents to the Emergency Department complaining of sudden onset, constant lower back pain that started yesterday night while riding in the car. Patient was traveling from [place] back to [place] and while in the car she began having low back pain near the sacrum area. By the time she reached home she had significant pains and had difficulty moving and getting out of a car. She denies injury. Pt states laying flat relieves some pain. She states ambulating, laying on her side, and certain movements worsen the pain. Pt has tried icy hot patches with no relief. Pt denies numbness, weakness, dysuria, urinary frequency, urinary or fecal incontinence, urinary retention or perineal numbness. No hematuria or flank pain. No nausea, vomiting, fever, chills or sweats. No other aggravating or alleviating factors. No other associated symptoms.    History reviewed. No pertinent past medical history. History reviewed. No pertinent past surgical history. History reviewed. No pertinent family history. History  Substance Use Topics  . Smoking status: Not on file  . Smokeless tobacco: Not on file  . Alcohol Use: No   OB History   Grav Para Term Preterm Abortions TAB SAB Ect Mult Living                 Review of Systems  Genitourinary: Negative for dysuria, frequency and hematuria.  Musculoskeletal: Positive for back pain.  Neurological: Negative for weakness  and numbness.  All other systems reviewed and are negative.    Allergies  Ibuprofen  Home Medications  No current outpatient prescriptions on file.  BP 153/84  Pulse 76  Temp(Src) 97.9 F (36.6 C) (Oral)  Resp 20  Ht 4\' 11"  (1.499 m)  Wt 240 lb (108.863 kg)  BMI 48.45 kg/m2  SpO2 100%  LMP 10/05/2012  Physical Exam  Nursing note and vitals reviewed. Constitutional: She is oriented to person, place, and time. She appears well-developed and well-nourished. No distress.  HENT:  Head: Normocephalic and atraumatic.  Eyes: EOM are normal.  Neck: Normal range of motion. Neck supple. No tracheal deviation present.  Cardiovascular: Normal rate, regular rhythm and normal heart sounds.  Exam reveals no gallop and no friction rub.   No murmur heard. Pulmonary/Chest: Effort normal and breath sounds normal. No respiratory distress. She has no wheezes. She has no rales.  Musculoskeletal: Normal range of motion.  No C-spine or T-spine tenderness. Tenderness upon palpation to lumbar spinal region.   Neurological: She is alert and oriented to person, place, and time.  Skin: Skin is warm and dry.  Psychiatric: She has a normal mood and affect. Her behavior is normal.    ED Course   Procedures   DIAGNOSTIC STUDIES: Oxygen Saturation is 100% on RA, normal by my interpretation.    COORDINATION OF CARE: 11:53 PM-Discussed treatment plan which includes UA and pain medication with pt at bedside and pt agreed to plan. Discussed with pt that it's not necessary  for xray and she agrees.   Results for orders placed during the hospital encounter of 10/26/12  URINALYSIS, ROUTINE W REFLEX MICROSCOPIC      Result Value Range   Color, Urine YELLOW  YELLOW   APPearance CLEAR  CLEAR   Specific Gravity, Urine 1.030  1.005 - 1.030   pH 5.5  5.0 - 8.0   Glucose, UA NEGATIVE  NEGATIVE mg/dL   Hgb urine dipstick TRACE (*) NEGATIVE   Bilirubin Urine NEGATIVE  NEGATIVE   Ketones, ur NEGATIVE  NEGATIVE  mg/dL   Protein, ur NEGATIVE  NEGATIVE mg/dL   Urobilinogen, UA 1.0  0.0 - 1.0 mg/dL   Nitrite NEGATIVE  NEGATIVE   Leukocytes, UA NEGATIVE  NEGATIVE  URINE MICROSCOPIC-ADD ON      Result Value Range   Squamous Epithelial / LPF RARE  RARE   WBC, UA 0-2  <3 WBC/hpf   Bacteria, UA RARE  RARE  POCT PREGNANCY, URINE      Result Value Range   Preg Test, Ur NEGATIVE  NEGATIVE      1. Low back pain     MDM  Patient seen and evaluated. Patient appears uncomfortable but in no acute distress.  UA unremarkable. Symptoms and pain is very positional and appears muscle skeletal in nature. I discussed options for plain x-rays for baseline evaluation. Patient does not have any concerning her right leg symptoms. She does not wish to have x-ray at this time we will try symptomatic treatment for the next few days to see if symptoms improve. She will followup with PCP for continued evaluation and treatment.     I personally performed the services described in this documentation, which was scribed in my presence. The recorded information has been reviewed and is accurate.   Angus Seller, PA-C 10/27/12 [phone]

## 2012-10-27 LAB — URINALYSIS, ROUTINE W REFLEX MICROSCOPIC
Bilirubin Urine: NEGATIVE
Glucose, UA: NEGATIVE mg/dL
Ketones, ur: NEGATIVE mg/dL
Leukocytes, UA: NEGATIVE
Nitrite: NEGATIVE
Protein, ur: NEGATIVE mg/dL
Specific Gravity, Urine: 1.03 (ref 1.005–1.030)
Urobilinogen, UA: 1 mg/dL (ref 0.0–1.0)
pH: 5.5 (ref 5.0–8.0)

## 2012-10-27 LAB — URINE MICROSCOPIC-ADD ON

## 2012-10-27 MED ORDER — CYCLOBENZAPRINE HCL 10 MG PO TABS: 10.0000 mg | ORAL_TABLET | Freq: Three times a day (TID) | ORAL | Status: DC | PRN

## 2012-10-27 MED ORDER — NAPROXEN 500 MG PO TABS: 500.0000 mg | ORAL_TABLET | Freq: Two times a day (BID) | ORAL | Status: DC

## 2012-10-27 MED ORDER — HYDROCODONE-ACETAMINOPHEN 5-325 MG PO TABS: 1.0000 | ORAL_TABLET | ORAL | Status: DC | PRN

## 2012-10-29 NOTE — ED Provider Notes (Signed)
Medical screening examination/treatment/procedure(s) were performed by non-physician practitioner and as supervising physician I was immediately available for consultation/collaboration.  Tyyonna Soucy, MD 10/29/12 1104 

## 2013-08-30 ENCOUNTER — Emergency Department (HOSPITAL_COMMUNITY)
Admission: EM | Admit: 2013-08-30 | Discharge: 2013-08-30 | Disposition: A | Discharge status: Home / Self Care | Payer: Managed Care, Other (non HMO) | Attending: Emergency Medicine | Admitting: Emergency Medicine

## 2013-08-30 ENCOUNTER — Encounter (HOSPITAL_COMMUNITY): Payer: Self-pay | Admitting: Emergency Medicine

## 2013-08-30 DIAGNOSIS — Z79899 Other long term (current) drug therapy: Secondary | ICD-10-CM | POA: Insufficient documentation

## 2013-08-30 DIAGNOSIS — R079 Chest pain, unspecified: Secondary | ICD-10-CM | POA: Insufficient documentation

## 2013-08-30 DIAGNOSIS — R0602 Shortness of breath: Secondary | ICD-10-CM | POA: Insufficient documentation

## 2013-08-30 DIAGNOSIS — R059 Cough, unspecified: Secondary | ICD-10-CM | POA: Insufficient documentation

## 2013-08-30 DIAGNOSIS — E669 Obesity, unspecified: Secondary | ICD-10-CM | POA: Insufficient documentation

## 2013-08-30 DIAGNOSIS — I10 Essential (primary) hypertension: Secondary | ICD-10-CM | POA: Insufficient documentation

## 2013-08-30 DIAGNOSIS — R42 Dizziness and giddiness: Secondary | ICD-10-CM | POA: Insufficient documentation

## 2013-08-30 DIAGNOSIS — R05 Cough: Secondary | ICD-10-CM | POA: Insufficient documentation

## 2013-08-30 HISTORY — DX: Essential (primary) hypertension: I10

## 2013-08-30 LAB — CBC WITH DIFFERENTIAL/PLATELET
Basophils Absolute: 0 10*3/uL (ref 0.0–0.1)
Basophils Relative: 0 % (ref 0–1)
Eosinophils Absolute: 0.2 10*3/uL (ref 0.0–0.7)
Eosinophils Relative: 2 % (ref 0–5)
HCT: 41.7 % (ref 36.0–46.0)
Hemoglobin: 13.6 g/dL (ref 12.0–15.0)
Lymphocytes Relative: 58 % — ABNORMAL HIGH (ref 12–46)
Lymphs Abs: 4.4 10*3/uL — ABNORMAL HIGH (ref 0.7–4.0)
MCH: 29.2 pg (ref 26.0–34.0)
MCHC: 32.6 g/dL (ref 30.0–36.0)
MCV: 89.7 fL (ref 78.0–100.0)
Monocytes Absolute: 0.9 10*3/uL (ref 0.1–1.0)
Monocytes Relative: 12 % (ref 3–12)
Neutro Abs: 2.2 10*3/uL (ref 1.7–7.7)
Neutrophils Relative %: 28 % — ABNORMAL LOW (ref 43–77)
Platelets: 382 10*3/uL (ref 150–400)
RBC: 4.65 MIL/uL (ref 3.87–5.11)
RDW: 13.2 % (ref 11.5–15.5)
WBC: 7.6 10*3/uL (ref 4.0–10.5)

## 2013-08-30 LAB — BASIC METABOLIC PANEL
BUN: 18 mg/dL (ref 6–23)
CO2: 27 mEq/L (ref 19–32)
Calcium: 9.5 mg/dL (ref 8.4–10.5)
Chloride: 99 mEq/L (ref 96–112)
Creatinine, Ser: 0.85 mg/dL (ref 0.50–1.10)
GFR calc Af Amer: >90 mL/min (ref >90)
GFR calc non Af Amer: 86 mL/min — ABNORMAL LOW (ref >90)
Glucose, Bld: 100 mg/dL — ABNORMAL HIGH (ref 70–99)
Potassium: 4.1 mEq/L (ref 3.7–5.3)
Sodium: 140 mEq/L (ref 137–147)

## 2013-08-30 LAB — TROPONIN I
Troponin I: <0.3 ng/mL (ref <0.30)
Troponin I: <0.3 ng/mL (ref <0.30)

## 2013-08-30 LAB — PRO B NATRIURETIC PEPTIDE: Pro B Natriuretic peptide (BNP): <5 pg/mL (ref 0–125)

## 2013-08-30 MED ORDER — NITROGLYCERIN 0.4 MG SL SUBL
0.4000 mg | SUBLINGUAL_TABLET | SUBLINGUAL | Status: DC | PRN
  Administered 2013-08-30: 0.4 mg via SUBLINGUAL
  Filled 2013-08-30: qty 1

## 2013-08-30 MED ORDER — ASPIRIN EC 325 MG PO TBEC: 325.0000 mg | DELAYED_RELEASE_TABLET | Freq: Every day | ORAL | Status: DC

## 2013-08-30 MED ORDER — ASPIRIN 81 MG PO CHEW
324.0000 mg | CHEWABLE_TABLET | Freq: Once | ORAL | Status: AC
  Administered 2013-08-30: 324 mg via ORAL
  Filled 2013-08-30: qty 4

## 2013-08-30 NOTE — ED Provider Notes (Addendum)
CSN: [id number]     Arrival date & time 08/30/13  1005 History   First MD Initiated Contact with Patient 08/30/13 1015     Chief Complaint  Patient presents with  . Arm Pain  . Shortness of Breath     (Consider location/radiation/quality/duration/timing/severity/associated sxs/prior Treatment) HPI Comments: Jean Garcia is an obese 38 YO AA with complaints of shortness of breath. This morning while walking up a set of stairs to get to work, she felt very tired and short of breath. She felt a little dizzy and "loopy" and felt the need to sit down at the top of the stairs for 5 minutes. 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 She noticed that her entire left arm and left side of neck had a dull ache, with the most pain centralized at the shoulder. She also feels tightness around her diaphragm and upper abdomen which worsens with deep breaths. She felt nervous about her health and called her PCP office, who told her she should come to the ER.  She denies current chest pain, palpations, jaw pain, headache, and vomiting.  Additionally, she mentioned feeling dizzy, short of breath, loopy, and clammy while out with some girlfriends 2 nights ago which resolved after 2 hours. Pt has no hx of smoking, substance abuse or fam hx of premature CAD.  The history is provided by the patient.    Past Medical History  Diagnosis Date  . Hypertension    History reviewed. No pertinent past surgical history. No family history on file. History  Substance Use Topics  . Smoking status: Never Smoker   . Smokeless tobacco: Not on file  . Alcohol Use: No   OB History   Grav Para Term Preterm Abortions TAB SAB Ect Mult Living                 Review of Systems  Constitutional: Negative for activity change.  HENT: Negative for facial swelling.   Respiratory: Positive for cough and shortness of breath. Negative for wheezing.   Cardiovascular: Negative for chest pain.  Gastrointestinal: Negative for  nausea, vomiting, abdominal pain, diarrhea, constipation, blood in stool and abdominal distention.  Genitourinary: Negative for hematuria and difficulty urinating.  Musculoskeletal: Negative for neck pain.  Skin: Negative for color change.  Neurological: Positive for dizziness. Negative for speech difficulty.  Hematological: Does not bruise/bleed easily.  Psychiatric/Behavioral: Negative for confusion.      Allergies  Ibuprofen  Home Medications   Prior to Admission medications   Medication Sig Start Date End Date Taking? Authorizing Provider  clotrimazole-betamethasone (LOTRISONE) cream Apply 1 application topically 2 (two) times daily as needed (eczema).  07/11/13  Yes Historical Provider, MD  furosemide (LASIX) 40 MG tablet Take 40 mg by mouth daily.  08/23/13  Yes Historical Provider, MD  lisinopril-hydrochlorothiazide (PRINZIDE,ZESTORETIC) 20-12.5 MG per tablet Take 1 tablet by mouth daily.  08/23/13  Yes Historical Provider, MD  potassium chloride (K-DUR) 10 MEQ tablet Take 20 mEq by mouth daily.  08/23/13  Yes Historical Provider, MD  aspirin EC 325 MG tablet Take 1 tablet (325 mg total) by mouth daily. 08/30/13   Lotus Gover, MD   BP 125/67  Pulse 82  Temp(Src) 98.8 F (37.1 C) (Oral)  Resp 16  SpO2 94% Physical Exam  Nursing note and vitals reviewed. Constitutional: She is oriented to person, place, and time. She appears well-developed and well-nourished.  HENT:  Head: Normocephalic and atraumatic.  Eyes: EOM are normal. Pupils are equal,  round, and reactive to light.  Neck: Neck supple.  Cardiovascular: Normal rate, regular rhythm, normal heart sounds and intact distal pulses.   No murmur heard. Pulmonary/Chest: Effort normal. No respiratory distress.  Abdominal: Soft. She exhibits no distension. There is no tenderness. There is no rebound and no guarding.  Neurological: She is alert and oriented to person, place, and time.  Skin: Skin is warm and dry.    ED Course   Procedures (including critical care time) Labs Review Labs Reviewed  CBC WITH DIFFERENTIAL - Abnormal; Notable for the following:    Neutrophils Relative % 28 (*)    Lymphocytes Relative 58 (*)    Lymphs Abs 4.4 (*)    All other components within normal limits  BASIC METABOLIC PANEL - Abnormal; Notable for the following:    Glucose, Bld 100 (*)    GFR calc non Af Amer 86 (*)    All other components within normal limits  TROPONIN I  PRO B NATRIURETIC PEPTIDE  TROPONIN I    Imaging Review No results found.   EKG Interpretation   Date/Time:  Monday August 30 2013 10:42:55 EDT Ventricular Rate:  64 PR Interval:  150 QRS Duration: 110 QT Interval:  421 QTC Calculation: 434 R Axis:   83 Text Interpretation:  Sinus rhythm Ventricular premature complex  Borderline ST elevation, lateral leads q waves unchanged No dynamic  channges Confirmed by Kathrynn Humble, MD, Thelma Comp [phone]) on 08/30/2013 10:47:24 AM      MDM   Final diagnoses:  Chest pain    Differential diagnosis includes: ACS syndrome CHF exacerbation Valvular disorder Myocarditis Pericarditis Pericardial effusion Pneumonia Pleural effusion Pulmonary edema PE Anemia  Pt had some chest discomfort, with arm pain and dib. Q waves in the inferior leads, unchanged. Spoke with Dr. Chapman Fitch, and she will see the patient as a follow up soon for further cardiac workup and risk stratification. Currently pain free.  HEART SCORE IS 2 History  Moderately suspicious 1   ECG  Non specific repolarisation disturbance 1   Age  ? 45 years 0   Risk Factors  No risk factors known 0   Troponin  ? normal limit 0   FOR PE:  Based on the interpretations below, patient's: WELLS score is: 0 PERC score is: NEGATIVE So highly unlikely for this to be PE  The Wells score: clinically suspected DVT - 3.0 points alternative diagnosis is less likely than PE - 3.0 points tachycardia (heart rate > 100) - 1.5 points immobilization  (? 3d)/surgery in previous four weeks - 1.5 points history of DVT or PE - 1.5 points hemoptysis - 1.0 points malignancy (with treatment within 6 months) or palliative - 1.0 points  Score >6.0 - High Score 2.0 to 6.0 - Moderate Score <2.0 - Low    PERC score: Age <50 years Heart Rate <100 bpm Oxygen Saturation >94% No unilateral Leg Swelling No Hemoptysis No surgery or trauma within 4 weeks No prior DVT or PE No hormone use    Varney Biles, MD 08/30/13 [place], MD 08/30/13 1631

## 2013-08-30 NOTE — ED Notes (Signed)
Repeat EKG completed and given to EDP.

## 2013-08-30 NOTE — ED Notes (Signed)
Report given to [place], [place]

## 2013-08-30 NOTE — ED Notes (Signed)
Pt presents to department for evaluation of L arm pain, and SOB. Onset this morning. Also states cough x1 week. Was recently started on Lisinopril. States she feels "foggy." 5/10 L arm pain at the time. Pt is alert and oriented x4. Respirations unlabored.

## 2013-08-30 NOTE — Discharge Instructions (Signed)
We saw you in the ER for the chest pain/shortness of breath. °All of our cardiac workup is normal, including labs, EKG and chest X-RAY are normal. °We are not sure what is causing your discomfort, but we feel comfortable sending you home at this time. The workup in the ER is not complete, and you should follow up with your primary care doctor for further evaluation. ° ° °Chest Pain (Nonspecific) °It is often hard to give a specific diagnosis for the cause of chest pain. There is always a chance that your pain could be related to something serious, such as a heart attack or a blood clot in the lungs. You need to follow up with your caregiver for further evaluation. °CAUSES  °· Heartburn. °· Pneumonia or bronchitis. °· Anxiety or stress. °· Inflammation around your heart (pericarditis) or lung (pleuritis or pleurisy). °· A blood clot in the lung. °· A collapsed lung (pneumothorax). It can develop suddenly on its own (spontaneous pneumothorax) or from injury (trauma) to the chest. °· Shingles infection (herpes zoster virus). °The chest wall is composed of bones, muscles, and cartilage. Any of these can be the source of the pain. °· The bones can be bruised by injury. °· The muscles or cartilage can be strained by coughing or overwork. °· The cartilage can be affected by inflammation and become sore (costochondritis). °DIAGNOSIS  °Lab tests or other studies, such as X-rays, electrocardiography, stress testing, or cardiac imaging, may be needed to find the cause of your pain.  °TREATMENT  °· Treatment depends on what may be causing your chest pain. Treatment may include: °· Acid blockers for heartburn. °· Anti-inflammatory medicine. °· Pain medicine for inflammatory conditions. °· Antibiotics if an infection is present. °· You may be advised to change lifestyle habits. This includes stopping smoking and avoiding alcohol, caffeine, and chocolate. °· You may be advised to keep your head raised (elevated) when sleeping.  This reduces the chance of acid going backward from your stomach into your esophagus. °· Most of the time, nonspecific chest pain will improve within 2 to 3 days with rest and mild pain medicine. °HOME CARE INSTRUCTIONS  °· If antibiotics were prescribed, take your antibiotics as directed. Finish them even if you start to feel better. °· For the next few days, avoid physical activities that bring on chest pain. Continue physical activities as directed. °· Do not smoke. °· Avoid drinking alcohol. °· Only take over-the-counter or prescription medicine for pain, discomfort, or fever as directed by your caregiver. °· Follow your caregiver's suggestions for further testing if your chest pain does not go away. °· Keep any follow-up appointments you made. If you do not go to an appointment, you could develop lasting (chronic) problems with pain. If there is any problem keeping an appointment, you must call to reschedule. °SEEK MEDICAL CARE IF:  °· You think you are having problems from the medicine you are taking. Read your medicine instructions carefully. °· Your chest pain does not go away, even after treatment. °· You develop a rash with blisters on your chest. °SEEK IMMEDIATE MEDICAL CARE IF:  °· You have increased chest pain or pain that spreads to your arm, neck, jaw, back, or abdomen. °· You develop shortness of breath, an increasing cough, or you are coughing up blood. °· You have severe back or abdominal pain, feel nauseous, or vomit. °· You develop severe weakness, fainting, or chills. °· You have a fever. °THIS IS AN EMERGENCY. Do not wait to   see if the pain will go away. Get medical help at once. Call your local emergency services (911 in [place] Do not drive yourself to the hospital. °MAKE SURE YOU:  °· Understand these instructions. °· Will watch your condition. °· Will get help right away if you are not doing well or get worse. °Document Released: 12/19/2004 Document Revised: 06/03/2011 Document Reviewed:  10/15/2007 °[employer]® Patient Information ©2014 [employer]. ° °

## 2013-08-30 NOTE — ED Notes (Signed)
Pt refused wheelchair, escorted out by RN.

## 2013-08-30 NOTE — ED Notes (Signed)
Went to see PCP on Monday because of leg swelling bilateral, told to stay off her feet, put on pill to remove feet.  Has been laying around all week on bedrest. Left leg larger, this morning she noticed increased shortness of breath and activity intolerance, as well as some sob on Saturday.  C/o of left arm and neck pain as well.

## 2013-09-21 ENCOUNTER — Encounter (HOSPITAL_COMMUNITY): Payer: Self-pay | Admitting: Psychiatry

## 2013-09-21 ENCOUNTER — Ambulatory Visit (INDEPENDENT_AMBULATORY_CARE_PROVIDER_SITE_OTHER): Payer: Managed Care, Other (non HMO) | Admitting: Psychiatry

## 2013-09-21 VITALS — BP 129/85 | HR 75 | Ht 61.0 in | Wt 242.0 lb

## 2013-09-21 DIAGNOSIS — F411 Generalized anxiety disorder: Secondary | ICD-10-CM

## 2013-09-21 DIAGNOSIS — F329 Major depressive disorder, single episode, unspecified: Secondary | ICD-10-CM

## 2013-09-21 DIAGNOSIS — F321 Major depressive disorder, single episode, moderate: Secondary | ICD-10-CM | POA: Insufficient documentation

## 2013-09-21 MED ORDER — HYDROXYZINE HCL 25 MG PO TABS: 25.0000 mg | ORAL_TABLET | Freq: Every evening | ORAL | Status: DC | PRN

## 2013-09-21 MED ORDER — SERTRALINE HCL 50 MG PO TABS: 50.0000 mg | ORAL_TABLET | Freq: Every day | ORAL | Status: DC
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
## 2013-11-23 ENCOUNTER — Ambulatory Visit (HOSPITAL_COMMUNITY): Payer: Self-pay | Admitting: Psychiatry

## 2015-12-15 ENCOUNTER — Other Ambulatory Visit: Payer: Self-pay | Admitting: Obstetrics

## 2015-12-18 LAB — CYTOLOGY - PAP

## 2017-05-19 ENCOUNTER — Ambulatory Visit (INDEPENDENT_AMBULATORY_CARE_PROVIDER_SITE_OTHER): Payer: BLUE CROSS/BLUE SHIELD | Admitting: Family Medicine

## 2017-05-19 ENCOUNTER — Encounter: Payer: Self-pay | Admitting: Family Medicine

## 2017-05-19 ENCOUNTER — Other Ambulatory Visit: Payer: Self-pay

## 2017-05-19 VITALS — BP 128/84 | HR 72 | Temp 98.6°F | Resp 16 | Ht 61.0 in | Wt 173.6 lb

## 2017-05-19 DIAGNOSIS — R700 Elevated erythrocyte sedimentation rate: Secondary | ICD-10-CM | POA: Diagnosis not present

## 2017-05-19 DIAGNOSIS — G4452 New daily persistent headache (NDPH): Secondary | ICD-10-CM | POA: Diagnosis not present

## 2017-05-19 DIAGNOSIS — R519 Headache, unspecified: Secondary | ICD-10-CM

## 2017-05-19 DIAGNOSIS — D509 Iron deficiency anemia, unspecified: Secondary | ICD-10-CM | POA: Diagnosis not present

## 2017-05-19 DIAGNOSIS — D500 Iron deficiency anemia secondary to blood loss (chronic): Secondary | ICD-10-CM | POA: Diagnosis not present

## 2017-05-19 DIAGNOSIS — R51 Headache: Secondary | ICD-10-CM | POA: Diagnosis not present

## 2017-05-19 LAB — POCT CBC
Granulocyte percent: 46.1 %G (ref 37–80)
HCT, POC: 19.8 % — AB (ref 37.7–47.9)
Hemoglobin: 9.5 g/dL — AB (ref 12.2–16.2)
Lymph, poc: 2.5 (ref 0.6–3.4)
MCH, POC: 25.4 pg — AB (ref 27–31.2)
MCHC: 32 g/dL (ref 31.8–35.4)
MCV: 79.3 fL — AB (ref 80–97)
MID (cbc): 0.3 (ref 0–0.9)
MPV: 7.6 fL (ref 0–99.8)
POC Granulocyte: 2.4 (ref 2–6.9)
POC LYMPH PERCENT: 48.6 %L (ref 10–50)
POC MID %: 5.3 %M (ref 0–12)
Platelet Count, POC: 478 10*3/uL — AB (ref 142–424)
RBC: 3.76 M/uL — AB (ref 4.04–5.48)
RDW, POC: 15 %
WBC: 5.1 10*3/uL (ref 4.6–10.2)

## 2017-05-19 LAB — POCT SEDIMENTATION RATE: POCT SED RATE: 91 mm/hr — AB (ref 0–22)

## 2017-05-19 MED ORDER — PROMETHAZINE HCL 25 MG/ML IJ SOLN
25.0000 mg | Freq: Once | INTRAMUSCULAR | Status: AC
  Administered 2017-05-19: 25 mg via INTRAMUSCULAR

## 2017-05-19 MED ORDER — BUTALBITAL-APAP-CAFFEINE 50-325-40 MG PO TABS: 1.0000 | ORAL_TABLET | Freq: Four times a day (QID) | ORAL | 0 refills | Status: DC | PRN

## 2017-05-19 MED ORDER — CYCLOBENZAPRINE HCL 10 MG PO TABS: 10.0000 mg | ORAL_TABLET | Freq: Every day | ORAL | 0 refills | Status: DC

## 2017-05-19 MED ORDER — KETOROLAC TROMETHAMINE 60 MG/2ML IM SOLN
60.0000 mg | Freq: Once | INTRAMUSCULAR | Status: AC
  Administered 2017-05-19: 60 mg via INTRAMUSCULAR

## 2017-05-19 NOTE — Patient Instructions (Addendum)
Stop using the over-the-counter headache medication as could be contributing to the analgesia rebound headache. Recheck with me in ~5d. If not better -> we need to get an MRI of your head. If you develop any other neurologic changes along with the headache, come back in immediately for further evaluation. Take the fioricet as needed throughout the day - don't wait until the HA gets bad to treat - try to treat as soon as you feel a HA and the medications should work much better. Take the cyclobenzaprine before bed every night. Heat to your neck and jaw as frequently as possible. Range of motion stretches and massage after the heat as much as possible.  IF you received an x-ray today, you will receive an invoice from [facility]. Please contact [facility] at [phone] with questions or concerns regarding your invoice.   IF you received labwork today, you will receive an invoice from [place]. Please contact [facility] at [phone] with questions or concerns regarding your invoice.   Our billing staff will not be able to assist you with questions regarding bills from these companies.  You will be contacted with the lab results as soon as they are available. The fastest way to get your results is to activate your My Chart account. Instructions are located on the last page of this paperwork. If you have not heard from [place] regarding the results in 2 weeks, please contact this office.      Tension Headache A tension headache is a feeling of pain, pressure, or aching that is often felt over the front and sides of the head. The pain can be dull, or it can feel tight (constricting). Tension headaches are not normally associated with nausea or vomiting, and they do not get worse with physical activity. Tension headaches can last from 30 minutes to several days. This is the most common type of headache. CAUSES The exact cause of this condition is not known. Tension headaches often begin  after stress, anxiety, or depression. Other triggers may include:  Alcohol.  Too much caffeine, or caffeine withdrawal.  Respiratory infections, such as colds, flu, or sinus infections.  Dental problems or teeth clenching.  Fatigue.  Holding your head and neck in the same position for a long period of time, such as while using a computer.  Smoking. SYMPTOMS Symptoms of this condition include:  A feeling of pressure around the head.  Dull, aching head pain.  Pain felt over the front and sides of the head.  Tenderness in the muscles of the head, neck, and shoulders. DIAGNOSIS This condition may be diagnosed based on your symptoms and a physical exam. Tests may be done, such as a CT scan or an MRI of your head. These tests may be done if your symptoms are severe or unusual. TREATMENT This condition may be treated with lifestyle changes and medicines to help relieve symptoms. HOME CARE INSTRUCTIONS Managing Pain  Take over-the-counter and prescription medicines only as told by your health care provider.  Lie down in a dark, quiet room when you have a headache.  If directed, apply ice to the head and neck area: ? Put ice in a plastic bag. ? Place a towel between your skin and the bag. ? Leave the ice on for 20 minutes, 2-3 times per day.  Use a heating pad or a hot shower to apply heat to the head and neck area as told by your health care provider. Eating and Drinking  Eat meals on  a regular schedule.  Limit alcohol use.  Decrease your caffeine intake, or stop using caffeine. General Instructions  Keep all follow-up visits as told by your health care provider. This is important.  Keep a headache journal to help find out what may trigger your headaches. For example, write down: ? What you eat and drink. ? How much sleep you get. ? Any change to your diet or medicines.  Try massage or other relaxation techniques.  Limit stress.  Sit up straight, and avoid  tensing your muscles.  Do not use tobacco products, including cigarettes, chewing tobacco, or e-cigarettes. If you need help quitting, ask your health care provider.  Exercise regularly as told by your health care provider.  Get 7-9 hours of sleep, or the amount recommended by your health care provider. SEEK MEDICAL CARE IF:  Your symptoms are not helped by medicine.  You have a headache that is different from what you normally experience.  You have nausea or you vomit.  You have a fever. SEEK IMMEDIATE MEDICAL CARE IF:  Your headache becomes severe.  You have repeated vomiting.  You have a stiff neck.  You have a loss of vision.  You have problems with speech.  You have pain in your eye or ear.  You have muscular weakness or loss of muscle control.  You lose your balance or you have trouble walking.  You feel faint or you pass out.  You have confusion. This information is not intended to replace advice given to you by your health care provider. Make sure you discuss any questions you have with your health care provider. Document Released: 03/11/2005 Document Revised: 11/30/2014 Document Reviewed: 07/04/2014 Elsevier Interactive Patient Education  2017 [place].   Analgesic Rebound Headache An analgesic rebound headache, sometimes called a medication overuse headache, is a headache that comes after pain medicine (analgesic) taken to treat the original (primary) headache has worn off. Any type of primary headache can return as a rebound headache if a person regularly takes analgesics more than three times a week to treat it. The types of primary headaches that are commonly associated with rebound headaches include:  Migraines.  Headaches that arise from tense muscles in the head and neck area (tension headaches).  Headaches that develop and happen again (recur) on one side of the head and around the eye (cluster headaches).  If rebound headaches continue, they  become chronic daily headaches. What are the causes? This condition may be caused by frequent use of:  Over-the-counter medicines such as aspirin, ibuprofen, and acetaminophen.  Sinus relief medicines and other medicines that contain caffeine.  Narcotic pain medicines such as codeine and oxycodone.  What are the signs or symptoms? The symptoms of a rebound headache are the same as the symptoms of the original headache. Some of the symptoms of specific types of headaches include: Migraine headache  Pulsing or throbbing pain on one or both sides of the head.  Severe pain that interferes with daily activities.  Pain that is worsened by physical activity.  Nausea, vomiting, or both.  Pain with exposure to bright light, loud noises, or strong smells.  General sensitivity to bright light, loud noises, or strong smells.  Visual changes.  Numbness of one or both arms. Tension headache  Pressure around the head.  Dull, aching head pain.  Pain felt over the front and sides of the head.  Tenderness in the muscles of the head, neck, and shoulders. Cluster headache  Severe pain that  begins in or around one eye or temple.  Redness and tearing in the eye on the same side as the pain.  Droopy or swollen eyelid.  One-sided head pain.  Nausea.  Runny nose.  Sweaty, pale facial skin.  Restlessness. How is this diagnosed? This condition is diagnosed by:  Reviewing your medical history. This includes the nature of your primary headaches.  Reviewing the types of pain medicines that you have been using to treat your headaches and how often you take them.  How is this treated? This condition may be treated or managed by:  Discontinuing frequent use of the analgesic medicine. Doing this may worsen your headaches at first, but the pain should eventually become more manageable, less frequent, and less severe.  Seeing a headache specialist. He or she may be able to help you  manage your headaches and help make sure there is not another cause of the headaches.  Using methods of stress relief, such as acupuncture, counseling, biofeedback, and massage. Talk with your health care provider about which methods might be good for you.  Follow these instructions at home:  Take over-the-counter and prescription medicines only as told by your health care provider.  Stop the repeated use of pain medicine as told by your health care provider. Stopping can be difficult. Carefully follow instructions from your health care provider.  Avoid triggers that are known to cause your primary headaches.  Keep all follow-up visits as told by your health care provider. This is important. Contact a health care provider if:  You continue to experience headaches after following treatments that your health care provider recommended. Get help right away if:  You develop new headache pain.  You develop headache pain that is different than what you have experienced in the past.  You develop numbness or tingling in your arms or legs.  You develop changes in your speech or vision. This information is not intended to replace advice given to you by your health care provider. Make sure you discuss any questions you have with your health care provider. Document Released: 06/01/2003 Document Revised: 09/29/2015 Document Reviewed: 08/14/2015 Elsevier Interactive Patient Education  [employer].

## 2017-05-20 LAB — COMPREHENSIVE METABOLIC PANEL
ALT: 9 IU/L (ref 0–32)
AST: 14 IU/L (ref 0–40)
Albumin/Globulin Ratio: 1.2 (ref 1.2–2.2)
Albumin: 4.1 g/dL (ref 3.5–5.5)
Alkaline Phosphatase: 66 IU/L (ref 39–117)
BUN/Creatinine Ratio: 14 (ref 9–23)
BUN: 8 mg/dL (ref 6–24)
Bilirubin Total: 0.2 mg/dL (ref 0.0–1.2)
CO2: 22 mmol/L (ref 20–29)
Calcium: 9.2 mg/dL (ref 8.7–10.2)
Chloride: 107 mmol/L — ABNORMAL HIGH (ref 96–106)
Creatinine, Ser: 0.58 mg/dL (ref 0.57–1.00)
GFR calc Af Amer: 132 mL/min/{1.73_m2} (ref >59)
GFR calc non Af Amer: 115 mL/min/{1.73_m2} (ref >59)
Globulin, Total: 3.5 g/dL (ref 1.5–4.5)
Glucose: 82 mg/dL (ref 65–99)
Potassium: 4.1 mmol/L (ref 3.5–5.2)
Sodium: 145 mmol/L — ABNORMAL HIGH (ref 134–144)
Total Protein: 7.6 g/dL (ref 6.0–8.5)

## 2017-05-20 LAB — FERRITIN: Ferritin: 8 ng/mL — ABNORMAL LOW (ref 15–150)

## 2017-05-21 ENCOUNTER — Ambulatory Visit: Payer: Self-pay | Admitting: Family Medicine

## 2017-05-21 ENCOUNTER — Telehealth: Payer: Self-pay | Admitting: Family Medicine

## 2017-05-21 DIAGNOSIS — D500 Iron deficiency anemia secondary to blood loss (chronic): Secondary | ICD-10-CM | POA: Insufficient documentation

## 2017-05-21 NOTE — Telephone Encounter (Signed)
PERFECT!! Thank you!

## 2017-05-21 NOTE — Progress Notes (Signed)
Subjective:   05/21/2017 at 1:40 AM.   Patient ID: Jean Garcia, female    DOB: 11-26-1975, 42 y.o.   MRN: [id number]  Chief Complaint  Patient presents with  . Headache    x 2 weeks, light sensitivity to eyes    Headache   Associated symptoms include photophobia.   Jean Garcia is a 42 y.o. female who presents to Primary Care at [facility] complaining of persistent HA x 2 wks.   Pt reports associated symptoms of photophobia  New she had some dental issues so went to dentist - she had some teeth broken off and decayed which she had removed 3 wks ago  But since then her HAs have gotten more prevalent. HAs are present when she wakes up in the morning and she has to continue taking medication throughout the day. She completed a course of amox.  HA is across forehead down Right cheek ear and around Right neck.  She changed her pillows but by the time she gets to work the pain is almost unbearable.  No fever/chills. No nasal congestion, no gum pain since the dental work and left abscess resolved.   No facial swelling, no tenderness to touch. She does get improvement with massage. HA not waking from sleep as she is having to take medications prior to bed to fall asleep. Last took ibuprofen 651m 2x yesterday.  No vision change, no tinnitus, no HA free periods, HA does worsen with physical activity as well as working on computer all day worsens it. No n/v.  No numbness, weakness, tremors. No h/o head injuries.  Does have dull ache in Rt shoulder from sleepin gon it Taking naproxen and goody's powder and well as ibuprofen [id number] (though was told not to take it as she was having some feet swelling).  She did have some tylenol #3 from her dental work but has not been using. She is having more teeth removed in 3d on right side lower.   [place]raines then "for a little bit" but those have resolved in the past month - never saw neurologist - not very severe.  [facility] [place]   Past Medical History:  Diagnosis Date  . Hypertension    Current Outpatient Medications on File Prior to Visit  Medication Sig Dispense Refill  . clotrimazole-betamethasone (LOTRISONE) cream Apply 1 application topically 2 (two) times daily as needed (eczema).     . Marland Kitchenspirin EC 325 MG tablet Take 1 tablet (325 mg total) by mouth daily. (Patient not taking: Reported on 05/19/2017) 30 tablet 0  . furosemide (LASIX) 40 MG tablet Take 40 mg by mouth daily.     . hydrOXYzine (ATARAX/VISTARIL) 25 MG tablet Take 1 tablet (25 mg total) by mouth at bedtime as needed (sleep). (Patient not taking: Reported on 05/19/2017) 30 tablet 1  . lisinopril-hydrochlorothiazide (PRINZIDE,ZESTORETIC) 20-12.5 MG per tablet Take 1 tablet by mouth daily.     . potassium chloride (K-DUR) 10 MEQ tablet Take 20 mEq by mouth daily.     . sertraline (ZOLOFT) 50 MG tablet Take 1 tablet (50 mg total) by mouth daily. 30 tablet 1   No current facility-administered medications on file prior to visit.    Allergies  Allergen Reactions  . Ibuprofen  sensitivity   Past Surgical History:  Procedure Laterality Date  . NO PAST SURGERIES     Family History  Problem Relation Age of Onset  . Alcohol abuse Neg Hx   . Anxiety disorder Neg Hx   . Bipolar disorder Neg Hx   . Depression Neg Hx   . Drug abuse Neg Hx    Social History   Socioeconomic History  . Marital status: Married    Spouse name: None  . Number of children: None  . Years of education: None  . Highest education level: None  Social Needs  . Financial resource strain: None  . Food insecurity - worry: None  . Food insecurity - inability: None  . Transportation needs - medical: None  . Transportation needs - non-medical: None  Occupational History  . None  Tobacco Use  . Smoking status: Never  Smoker  . Smokeless tobacco: Never Used  Substance and Sexual Activity  . Alcohol use: No  . Drug use: No  . Sexual activity: None  Other Topics Concern  . None  Social History Narrative  . None   Depression screen [facility] 2/9 05/19/2017  Decreased Interest 0  Down, Depressed, Hopeless 0  PHQ - 2 Score 0    Review of Systems  Eyes: Positive for photophobia.  Neurological: Positive for headaches.      Objective:   Physical Exam  Constitutional: She is oriented to person, place, and time. Vital signs are normal. She appears well-developed and well-nourished. No distress.  HENT:  Head: Normocephalic and atraumatic.  Right Ear: Tympanic membrane, external ear and ear canal normal.  Left Ear: Tympanic membrane, external ear and ear canal normal.  Nose: Rhinorrhea present. Right sinus exhibits no maxillary sinus tenderness. Left sinus exhibits no maxillary sinus tenderness.  Mouth/Throat: Uvula is midline, oropharynx is clear and moist and mucous membranes are normal. No oropharyngeal exudate or posterior oropharyngeal edema.  No tenderness over either temporal artery, 2+ temporal artery pulses palpable B  Eyes: Conjunctivae, EOM and lids are normal. Pupils are equal, round, and reactive to light. Right eye exhibits no chemosis and no discharge. Left eye exhibits no chemosis and no discharge.  Fundoscopic exam:      The right eye shows no exudate and no hemorrhage.       The left eye shows no exudate and no hemorrhage.  Neck: Normal range of motion and full passive range of motion without pain. Neck supple. No tracheal deviation present. No thyroid mass and no thyromegaly present.  Cardiovascular: Normal rate, regular rhythm, normal heart sounds and intact distal pulses.  Pulmonary/Chest: Effort normal and breath sounds normal. No respiratory distress.  Musculoskeletal: Normal range of motion.  Neurological: She is alert and oriented to person, place, and time. She has normal strength  and normal reflexes. She displays no tremor. No cranial nerve deficit or sensory deficit. She exhibits normal muscle tone. She displays a negative Romberg sign. Coordination and gait normal.  Neg pronator drift. Normal tandem gait. Normal finger to nose, rapid alternating movement, heel-to-shin.  Skin: Skin is warm and dry. She is not diaphoretic.  Psychiatric: She has a normal mood and affect. Her speech is normal and behavior is normal. Judgment and thought content normal. Cognition and memory are normal.  Nursing note and vitals reviewed.  BP 128/84   Pulse 72   Temp 98.6 F (37 C)   Resp 16   Ht _0  (1.549 m)   Wt 173 lb 9.6 oz (  78.7 kg)   SpO2 100%   BMI 32.80 kg/m      Visual Acuity Screening   Right eye Left eye Both eyes  Without correction: _0  With correction:      Results for orders placed or performed in visit on 05/19/17  POCT CBC  Result Value Ref Range   WBC 5.1 4.6 - 10.2 K/uL   Lymph, poc 2.5 0.6 - 3.4   POC LYMPH PERCENT 48.6 10 - 50 %L   MID (cbc) 0.3 0 - 0.9   POC MID % 5.3 0 - 12 %M   POC Granulocyte 2.4 2 - 6.9   Granulocyte percent 46.1 37 - 80 %G   RBC 3.76 (A) 4.04 - 5.48 M/uL   Hemoglobin 9.5 (A) 12.2 - 16.2 g/dL   HCT, POC 19.8 (A) 37.7 - 47.9 %   MCV 79.3 (A) 80 - 97 fL   MCH, POC 25.4 (A) 27 - 31.2 pg   MCHC 32.0 31.8 - 35.4 g/dL   RDW, POC 15.0 %   Platelet Count, POC 478 (A) 142 - 424 K/uL   MPV 7.6 0 - 99.8 fL    Assessment & Plan:   1. New daily persistent headache   2. Microcytic anemia - pt denies any h/o anema - start iron sup.  Very odd - unknown cause - certainly odd/concerning that pt does not have any significant HA hx other than some mild "migraines" she had for several yrs in her 35s but resolved for yrs.  Now she has had 2 weeks of constant HA, present upon awakening, intractable and to the point that she wants to cry and having trouble going to work.  Location is diffuse across B forehead radiating down Left  temple cheek around below Left ear to the occiput so could be tension HA.  Check labs to screen for other more serious cause. CBC nml so doubt sinuses.  Stop nsaids in case she is developing analgesia rebound HA. Rec trying to break HA today with toradol and promethazine - go home and sleep , the try fioricet for HA during day and cyclobenzaprine qhs in case tension HA - treat aggressively - do not wait until HA is severe are meds less likely to work. RTC or to ER immed if HA worsening.  If HA persisting in 3d, recheck in [place].  ADDENDUM: Very concern ESR is so elevated at 91!!!!- not GCA as not tender over temporal artery - sxs actually improve with temple pressure, vision ok. Asked pt to RTC for f/u OV - need to consider head CT vs MRI.  Placed urgent neurology referral.  Orders Placed This Encounter  Procedures  . Comprehensive metabolic panel  . Ferritin  . Ferritin  . Ambulatory referral to Neurology    Referral Priority:   Urgent    Referral Type:   Consultation    Referral Reason:   Specialty Services Required    Requested Specialty:   Neurology    Number of Visits Requested:   1  . POCT CBC  . POCT SEDIMENTATION RATE    Meds ordered this encounter  Medications  . ketorolac (TORADOL) injection 60 mg  . promethazine (PHENERGAN) injection 25 mg  . butalbital-acetaminophen-caffeine (FIORICET, ESGIC) 50-325-40 MG tablet    Sig: Take 1-2 tablets by mouth every 6 (six) hours as needed for headache.    Dispense:  30 tablet    Refill:  0  . cyclobenzaprine (FLEXERIL) 10 MG tablet  Sig: Take 1 tablet (10 mg total) by mouth at bedtime.    Dispense:  30 tablet    Refill:  0    Delman Cheadle, M.D.  Primary Care at [facility] [address] [place], [place] [address] [phone] phone [phone] fax  05/21/17 1:40 AM

## 2017-05-21 NOTE — Telephone Encounter (Signed)
[facility] in [place] and was able to get pt scheduled with their NP on Monday 05/26/17 at 10:00am with arrival time of 9:45am. I tried contacting pt to let her know this but was unable to leave detailed message, and asked that she call us back so I can relay this information. I am faxing referral over to them.

## 2017-05-21 NOTE — Telephone Encounter (Signed)
Please try another office - way to far out - [place]. Thanks so much. Let me know if you don't have any success. I would really like a week or two - her "sed rate" or inflammation level is VERY high and HA has some alarm symptoms.

## 2017-05-21 NOTE — Telephone Encounter (Signed)
Pt's neurology referral has been scheduled by [place] for 07/08/17. Do we want pt to keep this date or try another office? Thanks.

## 2017-05-24 ENCOUNTER — Ambulatory Visit (INDEPENDENT_AMBULATORY_CARE_PROVIDER_SITE_OTHER): Payer: BLUE CROSS/BLUE SHIELD | Admitting: Family Medicine

## 2017-05-24 ENCOUNTER — Encounter: Payer: Self-pay | Admitting: Family Medicine

## 2017-05-24 ENCOUNTER — Other Ambulatory Visit: Payer: Self-pay

## 2017-05-24 ENCOUNTER — Ambulatory Visit (INDEPENDENT_AMBULATORY_CARE_PROVIDER_SITE_OTHER): Payer: BLUE CROSS/BLUE SHIELD

## 2017-05-24 VITALS — BP 128/88 | HR 85 | Temp 98.1°F | Resp 16 | Ht 61.0 in | Wt 179.2 lb

## 2017-05-24 DIAGNOSIS — Z3009 Encounter for other general counseling and advice on contraception: Secondary | ICD-10-CM

## 2017-05-24 DIAGNOSIS — R51 Headache: Secondary | ICD-10-CM

## 2017-05-24 DIAGNOSIS — R700 Elevated erythrocyte sedimentation rate: Secondary | ICD-10-CM | POA: Diagnosis not present

## 2017-05-24 DIAGNOSIS — R519 Headache, unspecified: Secondary | ICD-10-CM

## 2017-05-24 DIAGNOSIS — D473 Essential (hemorrhagic) thrombocythemia: Secondary | ICD-10-CM | POA: Diagnosis not present

## 2017-05-24 DIAGNOSIS — D75839 Thrombocytosis, unspecified: Secondary | ICD-10-CM

## 2017-05-24 DIAGNOSIS — G4452 New daily persistent headache (NDPH): Secondary | ICD-10-CM

## 2017-05-24 LAB — POCT CBC
Granulocyte percent: 53.5 %G (ref 37–80)
HCT, POC: 32.8 % — AB (ref 37.7–47.9)
Hemoglobin: 10.5 g/dL — AB (ref 12.2–16.2)
Lymph, poc: 2.1 (ref 0.6–3.4)
MCH, POC: 25.1 pg — AB (ref 27–31.2)
MCHC: 32.1 g/dL (ref 31.8–35.4)
MCV: 78.2 fL — AB (ref 80–97)
MID (cbc): 0.4 (ref 0–0.9)
MPV: 7.5 fL (ref 0–99.8)
POC Granulocyte: 2.9 (ref 2–6.9)
POC LYMPH PERCENT: 38.6 %L (ref 10–50)
POC MID %: 7.9 %M (ref 0–12)
Platelet Count, POC: 550 10*3/uL — AB (ref 142–424)
RBC: 4.19 M/uL (ref 4.04–5.48)
RDW, POC: 14.5 %
WBC: 5.4 10*3/uL (ref 4.6–10.2)

## 2017-05-24 LAB — POCT SEDIMENTATION RATE: POCT SED RATE: 92 mm/hr — AB (ref 0–22)

## 2017-05-24 MED ORDER — PROMETHAZINE HCL 25 MG/ML IJ SOLN
25.0000 mg | Freq: Once | INTRAMUSCULAR | Status: AC
  Administered 2017-05-24: 25 mg via INTRAMUSCULAR

## 2017-05-24 MED ORDER — LEVOFLOXACIN 750 MG PO TABS: 750.0000 mg | ORAL_TABLET | Freq: Every day | ORAL | 0 refills | Status: DC

## 2017-05-24 MED ORDER — METHYLPREDNISOLONE ACETATE 80 MG/ML IJ SUSP
80.0000 mg | Freq: Once | INTRAMUSCULAR | Status: AC
  Administered 2017-05-24: 80 mg via INTRAMUSCULAR

## 2017-05-24 NOTE — Progress Notes (Addendum)
Subjective:  By signing my name below, I, [name], attest that this documentation has been prepared under the direction and in the presence of Delman Cheadle, MD. Electronically Signed: [name], [place]. 05/24/2017 , 2:41 PM .  Patient was seen in Room 2 .   Patient ID: Jean Garcia, female    DOB: 07-10-1975, 42 y.o.   MRN: [id number] Chief Complaint  Patient presents with  . Headache    follow up from 05/19/17, "not as bad as it was, but today is not a good day, went out to eat with the family and felt H/A coming on"   HPI Jean GIOVANNETTI is a 42 y.o. female who presents to Primary Care at [facility] for follow up. She has atypical history of migraines several years ago, which had resolved. But recently, developed new daily severe worsening headaches. She had a lot of dental work done for broken teeth being removed and completed course of amoxicillin, but her headaches continued to worsen, being present upon waking up in the morning across her forehead, right cheek and around the base of her right neck. She has temporary improvement with massage and ibuprofen. Patient was treated with promethazine and Toradol injections in office with PRN Fioricet and flexeril at home. However, blood work returned showing a severe iron deficiency anemia, elevated platelets, normal CMP but sed rate of 91.   Patient states her headaches did completely resolve after injections at last visit. She reports after receiving the injections at last visit, she didn't take any medication and was headache free. She did take some medications in the evenings. When she woke up this morning, she felt a small knob of over the right side of her forehead/temple, but told herself "this is manageable, today will be a good day". She went out to brunch with her family, she started having a tightening headache over the right side of her forehead, down in front of her right ear, and down to her neck. She hasn't taken any medications or  OTC medications today. She notes she did buy iron supplements, but hasn't started them yet. She denies sinus pain or ear pain.   She's currently only using condoms for birth control. She was prescribed OCP's but can't remember to take them regularly so stopped. She had Mirena IUD in the past which worked well except recurrently embedded in the uterus at the time of removal. She is reluctant to try Depo due to weight gain, but willing to try implant.   Past Medical History:  Diagnosis Date  . Hypertension    Past Surgical History:  Procedure Laterality Date  . NO PAST SURGERIES     Prior to Admission medications   Medication Sig Start Date End Date Taking? Authorizing Provider  butalbital-acetaminophen-caffeine (FIORICET, ESGIC) [phone] MG tablet Take 1-2 tablets by mouth every 6 (six) hours as needed for headache. 05/19/17  Yes Shawnee Knapp, MD  clotrimazole-betamethasone (LOTRISONE) cream Apply 1 application topically 2 (two) times daily as needed (eczema).  07/11/13  Yes [provider]  cyclobenzaprine (FLEXERIL) 10 MG tablet Take 1 tablet (10 mg total) by mouth at bedtime. 05/19/17  Yes Shawnee Knapp, MD  aspirin EC 325 MG tablet Take 1 tablet (325 mg total) by mouth daily. Patient not taking: Reported on 05/19/2017 08/30/13   Varney Biles, MD  furosemide (LASIX) 40 MG tablet Take 40 mg by mouth daily.  08/23/13   [provider]  hydrOXYzine (ATARAX/VISTARIL) 25 MG tablet Take 1  tablet (25 mg total) by mouth at bedtime as needed (sleep). Patient not taking: Reported on 05/19/2017 09/21/13   Charlcie Cradle, MD  lisinopril-hydrochlorothiazide (PRINZIDE,ZESTORETIC) 20-12.5 MG per tablet Take 1 tablet by mouth daily.  08/23/13   [provider]  potassium chloride (K-DUR) 10 MEQ tablet Take 20 mEq by mouth daily.  08/23/13   [provider]  sertraline (ZOLOFT) 50 MG tablet Take 1 tablet (50 mg total) by mouth daily. 09/21/13 09/21/14  Charlcie Cradle, MD   Allergies   Allergen Reactions  . Ibuprofen     sensitivity   Family History  Problem Relation Age of Onset  . Alcohol abuse Neg Hx   . Anxiety disorder Neg Hx   . Bipolar disorder Neg Hx   . Depression Neg Hx   . Drug abuse Neg Hx    Social History   Socioeconomic History  . Marital status: Married    Spouse name: None  . Number of children: None  . Years of education: None  . Highest education level: None  Social Needs  . Financial resource strain: None  . Food insecurity - worry: None  . Food insecurity - inability: None  . Transportation needs - medical: None  . Transportation needs - non-medical: None  Occupational History  . None  Tobacco Use  . Smoking status: Never Smoker  . Smokeless tobacco: Never Used  Substance and Sexual Activity  . Alcohol use: No  . Drug use: No  . Sexual activity: None  Other Topics Concern  . None  Social History Narrative  . None   Depression screen [facility] 2/9 05/24/2017 05/19/2017  Decreased Interest 0 0  Down, Depressed, Hopeless 0 0  PHQ - 2 Score 0 0    Review of Systems  Constitutional: Negative for chills, fatigue, fever and unexpected weight change.  HENT: Negative for ear pain and sinus pain.   Respiratory: Negative for cough.   Gastrointestinal: Negative for constipation, diarrhea, nausea and vomiting.  Skin: Negative for rash and wound.  Neurological: Positive for headaches. Negative for dizziness and weakness.       Objective:   Physical Exam  Constitutional: She is oriented to person, place, and time. She appears well-developed and well-nourished. No distress.  HENT:  Head: Normocephalic and atraumatic.  Right Ear: Tympanic membrane normal.  Left Ear: Tympanic membrane normal.  Nose: Mucosal edema and rhinorrhea present.  Mouth/Throat: Oropharynx is clear and moist.  Nares: edema in left nostril, and rhinitis L>R  Eyes: EOM are normal. Pupils are equal, round, and reactive to light.  Neck: Neck supple.  Cardiovascular:  Normal rate.  Murmur heard.  Systolic (ejection murmur, over left sternal border) murmur is present with a grade of 1/6. Pulmonary/Chest: Effort normal. No respiratory distress.  Musculoskeletal: Normal range of motion.  Lymphadenopathy:       Head (right side): Tonsillar adenopathy present.       Head (left side): Tonsillar adenopathy present.    She has cervical adenopathy.  Anterior cervical adenopathy, and tonsillar adenopathy, L>R   Neurological: She is alert and oriented to person, place, and time.  Skin: Skin is warm and dry.  Psychiatric: She has a normal mood and affect. Her behavior is normal.  Nursing note and vitals reviewed.   BP 128/88   Pulse 85   Temp 98.1 F (36.7 C) (Oral)   Resp 16   Ht '5\' 1"'  (1.549 m)   Wt 179 lb 3.2 oz (81.3 kg)   LMP 05/15/2017  SpO2 100%   BMI 33.86 kg/m   Results for orders placed or performed in visit on 05/24/17  POCT SEDIMENTATION RATE  Result Value Ref Range   POCT SED RATE 92 (A) 0 - 22 mm/hr  POCT CBC  Result Value Ref Range   WBC 5.4 4.6 - 10.2 K/uL   Lymph, poc 2.1 0.6 - 3.4   POC LYMPH PERCENT 38.6 10 - 50 %L   MID (cbc) 0.4 0 - 0.9   POC MID % 7.9 0 - 12 %M   POC Granulocyte 2.9 2 - 6.9   Granulocyte percent 53.5 37 - 80 %G   RBC 4.19 4.04 - 5.48 M/uL   Hemoglobin 10.5 (A) 12.2 - 16.2 g/dL   HCT, POC 32.8 (A) 37.7 - 47.9 %   MCV 78.2 (A) 80 - 97 fL   MCH, POC 25.1 (A) 27 - 31.2 pg   MCHC 32.1 31.8 - 35.4 g/dL   RDW, POC 14.5 %   Platelet Count, POC 550 (A) 142 - 424 K/uL   MPV 7.5 0 - 99.8 fL      Dg Sinuses Complete  Result Date: 05/24/2017 CLINICAL DATA:  severe daily persistent HA over sinuses and Rt maxilla/spenoid radiating to right occipital area x 3 week, s/p recent dental work but worsening EXAM: PARANASAL SINUSES - COMPLETE 3 + VIEW COMPARISON:  None. FINDINGS: The paranasal sinus are aerated. There is no evidence of sinus opacification air-fluid levels or mucosal thickening. No significant bone  abnormalities are seen. IMPRESSION: Negative. Electronically Signed   By: Lajean Manes M.D.   On: 05/24/2017 15:37   Ct Head Wo Contrast  Result Date: 05/26/2017 CLINICAL DATA:  New persistent daily headache. EXAM: CT HEAD WITHOUT CONTRAST TECHNIQUE: Contiguous axial images were obtained from the base of the skull through the vertex without intravenous contrast. COMPARISON:  None. FINDINGS: Brain: Ventricle size normal. Negative for hemorrhage mass or infarction Sella significantly enlarged and filled with CSF. No pituitary mass lesion. Vascular: Negative for hyperdense vessel Skull: Negative Sinuses/Orbits: Negative Other: None IMPRESSION: No acute intracranial abnormality Significant enlargement of the sella, filled with CSF. This may be incidental finding related empty sella. Intracranial hypertension can also cause this finding and be a cause of headaches. Electronically Signed   By: Franchot Gallo M.D.   On: 05/26/2017 14:08    Assessment & Plan:   1. Acute intractable headache, unspecified headache type - elevated esr very odd, check crp, nml wbc. No alarm sxs for autoimmune/rheumatologic illness, no possibility for GCA as actually gets relief w/ palpation of temporal arteries. Try treatment for poss sinus infxn and if no sig improvement in the next few days, or worsening at all, will proceed with head CT.   Has neurology c/s for Mon - 2d.  2. Elevated sed rate   3. Thrombocytosis ([place]) - assumed still from prior dental work several wks ago  4. New daily persistent headache   5. Family planning - not compling to OCPs, encouraged nexplanon, ordered UPT was not collected   05/28/17 ADDENDUM:  Idiopathic intracranial HTN is rising to the top of my differential diagnosis list for her HA cause due to head CT results with empty sella and HA persistence - temporary improvement after IM  promethazine/toradol last visit or IM promethazine/depomedrol at last visit. Did not respond to antibiotic  significantly so do not think this is an atypical sinus infection (as well as sinuses clear on head CT and sinus xray w/ nml wbc).  Pt needs neurology c/s asap to consider need for LP for diagnosis as possible next step in w/u. Started on topamax for sx control as do not want pt to get rebound HAs from fioricet. Pt used to be on lisinopril-hctz 20-12.5, lasix 40/K10 but is no longer taking - I wonder if this could have been controlling the IIH/sxs??? Do not know when she went off - discuss at next f/u OV Check tsh w/ next labs due to empty sella.    Orders Placed This Encounter  Procedures  . CT Head Wo Contrast    Standing Status:   Future    Standing Expiration Date:   08/25/2018    Order Specific Question:   Is patient pregnant?    Answer:   No    Order Specific Question:   Preferred imaging location?    Answer:   [facility] Specific Question:   Radiology Contrast Protocol - do NOT remove file path    Answer:   \\[email or web address].pdf  . CT Maxillofacial WO CM    Standing Status:   Future    Standing Expiration Date:   08/25/2018    Order Specific Question:   Is patient pregnant?    Answer:   No    Order Specific Question:   Preferred imaging location?    Answer:   [facility] Specific Question:   Radiology Contrast Protocol - do NOT remove file path    Answer:   \\[email or web address].pdf  . DG Sinuses Complete    Standing Status:   Future    Number of Occurrences:   1    Standing Expiration Date:   05/24/2018    Order Specific Question:   Reason for Exam (SYMPTOM  OR DIAGNOSIS REQUIRED)    Answer:   severe daily persistent HA over sinuses and Rt maxilla/spenoid radiating to right occipital area x 3 week, s/p recent dental work but worsening    Order Specific Question:   Is the patient pregnant?    Answer:   No    Order Specific Question:   Preferred imaging location?    Answer:   External  . C-reactive  protein  . POCT SEDIMENTATION RATE  . POCT CBC  . POCT urine pregnancy  . POCT urinalysis dipstick    Meds ordered this encounter  Medications  . methylPREDNISolone acetate (DEPO-MEDROL) injection 80 mg  . levofloxacin (LEVAQUIN) 750 MG tablet    Sig: Take 1 tablet (750 mg total) by mouth daily.    Dispense:  7 tablet    Refill:  0  . promethazine (PHENERGAN) injection 25 mg    I personally performed the services described in this documentation, which was scribed in my presence. The recorded information has been reviewed and considered, and addended by me as needed.   Delman Cheadle, M.D.  Primary Care at [facility] [address] [place],  [address] [phone] phone [phone] fax  05/25/17 10:13 AM

## 2017-05-24 NOTE — Patient Instructions (Addendum)
We are going to try to arrange your CT for Monday - Tuesday at the latest. If you are feeling dramatically better with the sinus infection treatment, ok to cancel the CT scan and recheck with me on Monday or Wednesday.  IF you received an x-ray today, you will receive an invoice from [facility]. Please contact [facility] at [phone] with questions or concerns regarding your invoice.   IF you received labwork today, you will receive an invoice from [place]. Please contact [facility] at [phone] with questions or concerns regarding your invoice.   Our billing staff will not be able to assist you with questions regarding bills from these companies.  You will be contacted with the lab results as soon as they are available. The fastest way to get your results is to activate your My Chart account. Instructions are located on the last page of this paperwork. If you have not heard from [place] regarding the results in 2 weeks, please contact this office.     Sinus Headache A sinus headache occurs when the paranasal sinuses become clogged or swollen. Paranasal sinuses are air pockets within the bones of the face. Sinus headaches can range from mild to severe. What are the causes? A sinus headache can result from various conditions that affect the sinuses, such as:  Colds.  Sinus infections.  Allergies.  What are the signs or symptoms? The main symptom of this condition is a headache that may feel like pain or pressure in the face, forehead, ears, or upper teeth. People who have a sinus headache often have other symptoms, such as:  Congested or runny nose.  Fever.  Inability to smell.  Weather changes can make symptoms worse. How is this diagnosed? This condition may be diagnosed based on:  A physical exam and medical history.  Imaging tests, such as a CT scan and MRI, to check for problems with the sinuses.  A specialist may look into the sinuses with a tool that  has a camera (endoscopy).  How is this treated? Treatment for this condition depends on the cause.  Sinus pain that is caused by a sinus infection may be treated with antibiotic medicine.  Sinus pain that is caused by allergies may be helped by allergy medicines (antihistamines) and medicated nasal sprays.  Sinus pain that is caused by congestion may be helped by flushing the nose and sinuses with saline solution.  Follow these instructions at home:  Take medicines only as directed by your health care provider.  If you were prescribed an antibiotic medicine, finish all of it even if you start to feel better.  If you have congestion, use a nasal spray to help reduce pressure.  If directed, apply a warm, moist washcloth to your face to help relieve pain. Contact a health care provider if:  You have headaches more than one time each week.  You have sensitivity to light or sound.  You have a fever.  You feel sick to your stomach (nauseous) or you throw up (vomit).  Your headaches do not get better with treatment. Many people think that they have a sinus headache when they actually have migraines or tension headaches. Get help right away if:  You have vision problems.  You have sudden, severe pain in your face or head.  You have a seizure.  You are confused.  You have a stiff neck. This information is not intended to replace advice given to you by your health care provider. Make sure you  discuss any questions you have with your health care provider. Document Released: 04/18/2004 Document Revised: 11/05/2015 Document Reviewed: 03/07/2014 Elsevier Interactive Patient Education  2018 [employer].  Iron Deficiency Anemia, Adult Iron deficiency anemia is a condition in which the concentration of red blood cells or hemoglobin in the blood is below normal because of too little iron. Hemoglobin is a substance in red blood cells that carries oxygen to the body's tissues. When the  concentration of red blood cells or hemoglobin is too low, not enough oxygen reaches these tissues. Iron deficiency anemia is usually long-lasting (chronic) and it develops over time. It may or may not cause symptoms. It is a common type of anemia. What are the causes? This condition may be caused by:  Not enough iron in the diet.  Blood loss caused by bleeding in the intestine.  Blood loss from a gastrointestinal condition like Crohn disease.  Frequent blood draws, such as from blood donation.  Abnormal absorption in the gut.  Heavy menstrual periods in women.  Cancers of the gastrointestinal system, such as colon cancer.  What are the signs or symptoms? Symptoms of this condition may include:  Fatigue.  Headache.  Pale skin, lips, and nail beds.  Poor appetite.  Weakness.  Shortness of breath.  Dizziness.  Cold hands and feet.  Fast or irregular heartbeat.  Irritability. This is more common in severe anemia.  Rapid breathing. This is more common in severe anemia.  Mild anemia may not cause any symptoms. How is this diagnosed? This condition is diagnosed based on:  Your medical history.  A physical exam.  Blood tests.  You may have additional tests to find the underlying cause of your anemia, such as:  Testing for blood in the stool (fecal occult blood test).  A procedure to see inside your colon and rectum (colonoscopy).  A procedure to see inside your esophagus and stomach (endoscopy).  A test in which cells are removed from bone marrow (bone marrow aspiration) or fluid is removed from the bone marrow to be examined (biopsy). This is rarely needed.  How is this treated? This condition is treated by correcting the cause of your iron deficiency. Treatment may involve:  Adding iron-rich foods to your diet.  Taking iron supplements. If you are pregnant or breastfeeding, you may need to take extra iron because your normal diet usually does not  provide the amount of iron that you need.  Increasing vitamin C intake. Vitamin C helps your body absorb iron. Your health care provider may recommend that you take iron supplements along with a glass of orange juice or a vitamin C supplement.  Medicines to make heavy menstrual flow lighter.  Surgery.  You may need repeat blood tests to determine whether treatment is working. Depending on the underlying cause, the anemia should be corrected within 2 months of starting treatment. If the treatment does not seem to be working, you may need more testing. Follow these instructions at home: Medicines  Take over-the-counter and prescription medicines only as told by your health care provider. This includes iron supplements and vitamins.  If you cannot tolerate taking iron supplements by mouth, talk with your health care provider about taking them through a vein (intravenously) or an injection into a muscle.  For the best iron absorption, you should take iron supplements when your stomach is empty. If you cannot tolerate them on an empty stomach, you may need to take them with food.  Do not drink milk or  take antacids at the same time as your iron supplements. Milk and antacids may interfere with iron absorption.  Iron supplements can cause constipation. To prevent constipation, include fiber in your diet as told by your health care provider. A stool softener may also be recommended. Eating and drinking  Talk with your health care provider before changing your diet. He or she may recommend that you eat foods that contain a lot of iron, such as: ? Liver. ? Low-fat (lean) beef. ? Breads and cereals that have iron added to them (are fortified). ? Eggs. ? Dried fruit. ? Dark green, leafy vegetables.  To help your body use the iron from iron-rich foods, eat those foods at the same time as fresh fruits and vegetables that are high in vitamin C. Foods that are high in vitamin C  include: ? Oranges. ? Peppers. ? Tomatoes. ? Mangoes.  Drinkenoughfluid to keep your urine clear or pale yellow. General instructions  Return to your normal activities as told by your health care provider. Ask your health care provider what activities are safe for you.  Practice good hygiene. Anemia can make you more prone to illness and infection.  Keep all follow-up visits as told by your health care provider. This is important. Contact a health care provider if:  You feel nauseous or you vomit.  You feel weak.  You have unexplained sweating.  You develop symptoms of constipation, such as: ? Having fewer than three bowel movements a week. ? Straining to have a bowel movement. ? Having stools that are hard, dry, or larger than normal. ? Feeling full or bloated. ? Pain in the lower abdomen. ? Not feeling relief after having a bowel movement. Get help right away if:  You faint. If this happens, do not drive yourself to the hospital. Call your local emergency services (911 in the [place]  You have chest pain.  You have shortness of breath that: ? Is severe. ? Gets worse with physical activity.  You have a rapid heartbeat.  You become light-headed when getting up from a sitting or lying down position. This information is not intended to replace advice given to you by your health care provider. Make sure you discuss any questions you have with your health care provider. Document Released: 03/08/2000 Document Revised: 11/29/2015 Document Reviewed: 11/29/2015 Elsevier Interactive Patient Education  2018 [employer].

## 2017-05-25 LAB — C-REACTIVE PROTEIN: CRP: 4.7 mg/L (ref 0.0–4.9)

## 2017-05-26 ENCOUNTER — Telehealth: Payer: Self-pay | Admitting: *Deleted

## 2017-05-26 ENCOUNTER — Other Ambulatory Visit: Payer: Self-pay | Admitting: Family Medicine

## 2017-05-26 ENCOUNTER — Telehealth: Payer: Self-pay | Admitting: Family Medicine

## 2017-05-26 ENCOUNTER — Ambulatory Visit (HOSPITAL_COMMUNITY)
Admission: RE | Admit: 2017-05-26 | Discharge: 2017-05-26 | Disposition: A | Discharge status: Home / Self Care | Payer: BLUE CROSS/BLUE SHIELD | Source: Ambulatory Visit | Attending: Family Medicine | Admitting: Family Medicine

## 2017-05-26 DIAGNOSIS — R519 Headache, unspecified: Secondary | ICD-10-CM

## 2017-05-26 DIAGNOSIS — R700 Elevated erythrocyte sedimentation rate: Secondary | ICD-10-CM | POA: Diagnosis present

## 2017-05-26 DIAGNOSIS — D473 Essential (hemorrhagic) thrombocythemia: Secondary | ICD-10-CM | POA: Diagnosis present

## 2017-05-26 DIAGNOSIS — D75839 Thrombocytosis, unspecified: Secondary | ICD-10-CM

## 2017-05-26 DIAGNOSIS — G4452 New daily persistent headache (NDPH): Secondary | ICD-10-CM | POA: Diagnosis not present

## 2017-05-26 DIAGNOSIS — G932 Benign intracranial hypertension: Secondary | ICD-10-CM | POA: Diagnosis not present

## 2017-05-26 DIAGNOSIS — R51 Headache: Secondary | ICD-10-CM

## 2017-05-26 MED ORDER — TOPIRAMATE 25 MG PO TABS: ORAL_TABLET | ORAL | 0 refills | Status: DC

## 2017-05-26 NOTE — Telephone Encounter (Signed)
Pt has STAT CT Head w/o contrast and CT Maxillofacial w/o contrast ordered and scheduled for 1pm this afternoon. I was able to get CT Head approved with [employer] for prior auth, but unable to get Maxiollofacial CT approved due to symptoms being acute. I spoke with Almyra Free to let her know this, and she spoke with Dr. Brigitte Pulse to see if we wanted to try a peer to peer or just do the CT Head. Dr. Brigitte Pulse said to go ahead with the CT Head for now. I spoke with [facility] to let them know to cancel Maxillofacial CT.

## 2017-05-26 NOTE — Telephone Encounter (Signed)
Spoke with patient advised [facility] at 12:45 Radiology Department.

## 2017-05-28 ENCOUNTER — Telehealth: Payer: Self-pay | Admitting: Family Medicine

## 2017-05-28 NOTE — Addendum Note (Signed)
Addended by: Shawnee Knapp on: 05/28/2017 03:56 AM   Modules accepted: Orders

## 2017-05-28 NOTE — Telephone Encounter (Signed)
Called and left message to remind pt of their apt tomorrow. Advised of building number and time restrictions. °

## 2017-05-29 ENCOUNTER — Ambulatory Visit: Payer: BLUE CROSS/BLUE SHIELD | Admitting: Family Medicine

## 2017-05-30 ENCOUNTER — Telehealth: Payer: Self-pay | Admitting: Family Medicine

## 2017-05-30 NOTE — Telephone Encounter (Signed)
Received fax from [facility] from 05/27/17. Pt no showed appt but it does not state when appt was. I know we made pt an appt on 05/26/17 but notes stated pt canceled that appt. The fax included pt's CT scan that was sent to them from 05/26/17. Just fyi for provider. Thanks!

## 2017-06-02 ENCOUNTER — Ambulatory Visit: Payer: Self-pay | Admitting: Family Medicine

## 2017-06-02 DIAGNOSIS — Z0289 Encounter for other administrative examinations: Secondary | ICD-10-CM

## 2017-06-02 NOTE — Telephone Encounter (Signed)
I imaging this is the "no-show" from Monday 3/4 when we had her go ended up having her get her head CT instead. She did tell me that she spoke to the office on Monday and told them that she wasn't going to be able to make it to her appt that day and was intending to call them on Tues 3/5 to reschedule.

## 2017-06-13 NOTE — Telephone Encounter (Signed)
Will you please call pt and see how she is doing? Has she seen neurology yet?  How is her HA on topamax?

## 2017-06-20 NOTE — Telephone Encounter (Signed)
No answer when trying to contact pt but she has an appointment on 07/08/2017 with Neurology.

## 2017-06-23 ENCOUNTER — Other Ambulatory Visit: Payer: Self-pay | Admitting: Family Medicine

## 2017-06-24 MED ORDER — TOPIRAMATE 50 MG PO TABS: 50.0000 mg | ORAL_TABLET | Freq: Two times a day (BID) | ORAL | 1 refills | Status: DC

## 2017-06-24 NOTE — Telephone Encounter (Signed)
Please call pt to offer to schedule her a her a f/u OV within the next 2 months before we can cont to refill her topamax.

## 2017-06-26 ENCOUNTER — Telehealth: Payer: Self-pay | Admitting: Family Medicine

## 2017-06-26 NOTE — Telephone Encounter (Signed)
Copied from [place] [id number]. Topic: Inquiry >> Jun 26, 2017  2:21 PM Oliver Pila B wrote: Reason for CRM: pt called b/c her employer ([employer] ) has faxed over a medical questionnaire(faxed over around 8am this morning); pt is needing this filled out so that she can wear sunglasses at work to help w/ her headaches, call pt if needed

## 2017-07-03 ENCOUNTER — Encounter: Payer: Self-pay | Admitting: Neurology

## 2017-07-08 ENCOUNTER — Ambulatory Visit: Payer: BLUE CROSS/BLUE SHIELD | Admitting: Neurology

## 2017-08-20 ENCOUNTER — Encounter (HOSPITAL_COMMUNITY): Payer: Self-pay

## 2017-09-15 ENCOUNTER — Encounter: Payer: Self-pay | Admitting: Hematology & Oncology

## 2017-09-16 ENCOUNTER — Inpatient Hospital Stay: Payer: BLUE CROSS/BLUE SHIELD

## 2017-09-16 ENCOUNTER — Inpatient Hospital Stay: Payer: BLUE CROSS/BLUE SHIELD | Attending: Hematology & Oncology | Admitting: Hematology & Oncology

## 2017-09-16 VITALS — BP 138/81 | HR 65 | Temp 98.8°F | Resp 18 | Wt 180.8 lb

## 2017-09-16 DIAGNOSIS — D6859 Other primary thrombophilia: Secondary | ICD-10-CM | POA: Diagnosis not present

## 2017-09-16 DIAGNOSIS — O99111 Other diseases of the blood and blood-forming organs and certain disorders involving the immune mechanism complicating pregnancy, first trimester: Secondary | ICD-10-CM | POA: Diagnosis not present

## 2017-09-16 DIAGNOSIS — I10 Essential (primary) hypertension: Secondary | ICD-10-CM | POA: Insufficient documentation

## 2017-09-16 DIAGNOSIS — N96 Recurrent pregnancy loss: Secondary | ICD-10-CM

## 2017-09-16 MED ORDER — ENOXAPARIN SODIUM 40 MG/0.4ML ~~LOC~~ SOLN: 40.0000 mg | SUBCUTANEOUS | 9 refills | Status: AC

## 2017-09-16 MED FILL — ENOXAPARIN 40 MG/0.4 ML SYR: 40 | 30 days supply | Qty: 12 | Fill #0

## 2017-09-16 NOTE — Progress Notes (Signed)
Referral MD  Reason for Referral: First trimester pregnancy; history of 2 miscarriages; Protein S deficiency  No chief complaint on file. : I am pregnant.  HPI: [name] is a very nice 42 year old African-American female.  This is her eighth pregnancy.  She has 5 children.  She has had 2 miscarriages.  Both miscarriages were within 6-8 weeks of first finding out she is pregnant.  She was not planning on this pregnancy.  She sees Dr. Garwin Brothers.  As always, Dr. Garwin Brothers is incredibly thorough.  She did a hypercoagulable panel on her.  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  Because of this, she felt that [name] needed to come to a hematologist for evaluation.  She feels well.  She does have some nausea.  She has some swelling in her feet.  With her last pregnancy, she apparently developed eclampsia.  She is working.  She works for [employer] and [employer].  There is no history in the family of any blood clots.  She herself has had no problems with the pregnancies.  She had normal deliveries outside of the last one that had to be delivered via C-section because of the eclampsia.  She does not smoke.  She does not drink.  There is no history of diabetes.  There is no history of sickle cell disease.  Overall, her performance status is ECOG 1.    Past Medical History:  Diagnosis Date  . Hypertension   :  Past Surgical History:  Procedure Laterality Date  . NO PAST SURGERIES    :   Current Outpatient Medications:  .  butalbital-acetaminophen-caffeine (FIORICET, ESGIC) 50-325-40 MG tablet, Take 1-2 tablets by mouth every 6 (six) hours as needed for headache., Disp: 30 tablet, Rfl: 0 .  clotrimazole-betamethasone (LOTRISONE) cream, Apply 1 application topically 2 (two) times daily as needed (eczema). , Disp: , Rfl:  .  cyclobenzaprine (FLEXERIL) 10 MG tablet, Take 1 tablet (10 mg total) by mouth at bedtime., Disp: 30 tablet, Rfl: 0 .  levofloxacin  (LEVAQUIN) 750 MG tablet, Take 1 tablet (750 mg total) by mouth daily., Disp: 7 tablet, Rfl: 0 .  topiramate (TOPAMAX) 25 MG tablet, Take 1 tab po qhs x 1 wk, then 1 po bid x 1 wk, then 1 po qam and 2 po qhs x 1 wk, then 2 po bid, Disp: 120 tablet, Rfl: 0 .  topiramate (TOPAMAX) 50 MG tablet, Take 1 tablet (50 mg total) by mouth 2 (two) times daily. **FOLLOW-UP OFFICE VISIT NEEDED FOR REFILLS**, Disp: 60 tablet, Rfl: 1 .  enoxaparin (LOVENOX) 40 MG/0.4ML injection, Inject 0.4 mLs (40 mg total) into the skin daily., Disp: 30 Syringe, Rfl: 9:  :  Allergies  Allergen Reactions  . Ibuprofen     sensitivity  :  Family History  Problem Relation Age of Onset  . Alcohol abuse Neg Hx   . Anxiety disorder Neg Hx   . Bipolar disorder Neg Hx   . Depression Neg Hx   . Drug abuse Neg Hx   :  Social History   Socioeconomic History  . Marital status: Married    Spouse name: Not on file  . Number of children: Not on file  . Years of education: Not on file  . Highest education level: Not on file  Occupational History  . Not on file  Social Needs  . Financial resource strain: Not on file  . Food insecurity:  Worry: Not on file    Inability: Not on file  . Transportation needs:    Medical: Not on file    Non-medical: Not on file  Tobacco Use  . Smoking status: Never Smoker  . Smokeless tobacco: Never Used  Substance and Sexual Activity  . Alcohol use: No  . Drug use: No  . Sexual activity: Not on file  Lifestyle  . Physical activity:    Days per week: Not on file    Minutes per session: Not on file  . Stress: Not on file  Relationships  . Social connections:    Talks on phone: Not on file    Gets together: Not on file    Attends religious service: Not on file    Active member of club or organization: Not on file    Attends meetings of clubs or organizations: Not on file    Relationship status: Not on file  . Intimate partner violence:    Fear of current or ex partner: Not  on file    Emotionally abused: Not on file    Physically abused: Not on file    Forced sexual activity: Not on file  Other Topics Concern  . Not on file  Social History Narrative  . Not on file  :  Review of Systems  Constitutional: Negative.   HENT: Negative.   Eyes: Negative.   Respiratory: Negative.   Cardiovascular: Negative.   Gastrointestinal: Positive for nausea.  Genitourinary: Negative.   Musculoskeletal: Negative.   Skin: Negative.   Neurological: Negative.   Endo/Heme/Allergies: Negative.   Psychiatric/Behavioral: Negative.      Exam: Well-developed well-nourished African-American female in no obvious distress.  Vital signs are temperature of 98.8.  Pulse 65.  Blood pressure 138/81.  Weight is 180 pounds.  Head neck exam shows no ocular or oral lesions.  There are no palpable cervical or supraclavicular lymph nodes.  Lungs are clear bilaterally.  Cardiac exam regular rate and rhythm with no murmurs, rubs or bruits.  Abdomen is soft.  She has good bowel sounds.  There is no fluid wave.  There is no guarding or rebound tenderness.  There is no palpable liver or spleen tip.  Back exam shows no tenderness over the spine, ribs or hips.  Extremities show some swelling in her feet.  She has good range of motion of her joints.  Skin exam shows no rashes, ecchymoses or petechia.  Neurological exam shows no focal neurological deficits. @IPVITALS @   No results for input(s): WBC, HGB, HCT, PLT in the last 72 hours. No results for input(s): NA, K, CL, CO2, GLUCOSE, BUN, CREATININE, CALCIUM in the last 72 hours.  Blood smear review: None  Pathology: None    Assessment and Plan: [name] is a nice 41 year old African-American female who is pregnant.  She has had 2 prior miscarriages.  She has had 5 full term pregnancies.  She has the mild Protein S deficiency.  I am not sure how clinically significant this really is but yet, I cannot deny the fact that she has had 2  miscarriages.  I think that she probably will need prophylactic anticoagulation during this pregnancy.  I think that Lovenox at 40 mg subcu daily would be appropriate.  Her due date is April 12, 2018.  She has past the time that she has had her past miscarriages.  As such, I feel that she probably will get through this pregnancy without any problems if we did not use  Lovenox.  However, I think that it would be prudent for [place] to use low-dose Lovenox given the fact that we know that she has the Protein S deficiency and that she has had past miscarriages and that the last pregnancy had eclampsia.  I spent about 45 minutes with her.  All the time was spent face-to-face with her.  I answered all of her questions.  I went over Lovenox.  I went over why it would be helpful.  She understands and agrees.  We will try to get the Lovenox from our pharmacy downstairs.  She will then hopefully be able to come up and have 1 of our nurses show her how to do the injections.  I would have her on Lovenox for her pregnancy and after her delivery.  I would like to see her back in 3-4 weeks.

## 2017-10-17 ENCOUNTER — Inpatient Hospital Stay: Payer: BLUE CROSS/BLUE SHIELD

## 2017-10-17 ENCOUNTER — Other Ambulatory Visit: Payer: Self-pay

## 2017-10-17 ENCOUNTER — Encounter: Payer: Self-pay | Admitting: Hematology & Oncology

## 2017-10-17 ENCOUNTER — Inpatient Hospital Stay: Payer: BLUE CROSS/BLUE SHIELD | Attending: Hematology & Oncology | Admitting: Hematology & Oncology

## 2017-10-17 VITALS — BP 150/88 | HR 77 | Temp 98.4°F | Resp 16 | Wt 182.0 lb

## 2017-10-17 DIAGNOSIS — Z3A16 16 weeks gestation of pregnancy: Secondary | ICD-10-CM | POA: Insufficient documentation

## 2017-10-17 DIAGNOSIS — Z7901 Long term (current) use of anticoagulants: Secondary | ICD-10-CM | POA: Insufficient documentation

## 2017-10-17 DIAGNOSIS — D509 Iron deficiency anemia, unspecified: Secondary | ICD-10-CM | POA: Insufficient documentation

## 2017-10-17 DIAGNOSIS — D6859 Other primary thrombophilia: Secondary | ICD-10-CM | POA: Diagnosis not present

## 2017-10-17 DIAGNOSIS — Z8759 Personal history of other complications of pregnancy, childbirth and the puerperium: Secondary | ICD-10-CM

## 2017-10-17 DIAGNOSIS — D500 Iron deficiency anemia secondary to blood loss (chronic): Secondary | ICD-10-CM

## 2017-10-17 DIAGNOSIS — O99112 Other diseases of the blood and blood-forming organs and certain disorders involving the immune mechanism complicating pregnancy, second trimester: Secondary | ICD-10-CM

## 2017-10-17 DIAGNOSIS — N96 Recurrent pregnancy loss: Secondary | ICD-10-CM

## 2017-10-17 LAB — CBC WITH DIFFERENTIAL (CANCER CENTER ONLY)
Basophils Absolute: 0 10*3/uL (ref 0.0–0.1)
Basophils Relative: 0 %
Eosinophils Absolute: 0.2 10*3/uL (ref 0.0–0.5)
Eosinophils Relative: 2 %
HCT: 27.8 % — ABNORMAL LOW (ref 34.8–46.6)
Hemoglobin: 8.6 g/dL — ABNORMAL LOW (ref 11.6–15.9)
Lymphocytes Relative: 29 %
Lymphs Abs: 3.4 10*3/uL — ABNORMAL HIGH (ref 0.9–3.3)
MCH: 23.2 pg — ABNORMAL LOW (ref 26.0–34.0)
MCHC: 30.9 g/dL — ABNORMAL LOW (ref 32.0–36.0)
MCV: 75.1 fL — ABNORMAL LOW (ref 81.0–101.0)
Monocytes Absolute: 1.3 10*3/uL — ABNORMAL HIGH (ref 0.1–0.9)
Monocytes Relative: 11 %
Neutro Abs: 6.6 10*3/uL — ABNORMAL HIGH (ref 1.5–6.5)
Neutrophils Relative %: 58 %
Platelet Count: 522 10*3/uL — ABNORMAL HIGH (ref 145–400)
RBC: 3.7 MIL/uL (ref 3.70–5.32)
RDW: 15.3 % (ref 11.1–15.7)
WBC Count: 11.4 10*3/uL — ABNORMAL HIGH (ref 3.9–10.0)

## 2017-10-17 LAB — COMPREHENSIVE METABOLIC PANEL
ALT: 19 U/L (ref 0–44)
AST: 18 U/L (ref 15–41)
Albumin: 3 g/dL — ABNORMAL LOW (ref 3.5–5.0)
Alkaline Phosphatase: 43 U/L (ref 38–126)
Anion gap: 8 (ref 5–15)
BUN: 9 mg/dL (ref 6–20)
CO2: 23 mmol/L (ref 22–32)
Calcium: 8.9 mg/dL (ref 8.9–10.3)
Chloride: 105 mmol/L (ref 98–111)
Creatinine, Ser: 0.5 mg/dL (ref 0.44–1.00)
GFR calc Af Amer: >60 mL/min (ref >60)
GFR calc non Af Amer: >60 mL/min (ref >60)
Glucose, Bld: 101 mg/dL — ABNORMAL HIGH (ref 70–99)
Potassium: 3.8 mmol/L (ref 3.5–5.1)
Sodium: 136 mmol/L (ref 135–145)
Total Bilirubin: 0.2 mg/dL — ABNORMAL LOW (ref 0.3–1.2)
Total Protein: 7.6 g/dL (ref 6.5–8.1)

## 2017-10-17 LAB — SAVE SMEAR

## 2017-10-17 LAB — IRON AND TIBC
Iron: 14 ug/dL — ABNORMAL LOW (ref 28–170)
Saturation Ratios: 3 % — ABNORMAL LOW (ref 10.4–31.8)
TIBC: 419 ug/dL (ref 250–450)
UIBC: 405 ug/dL

## 2017-10-17 LAB — FERRITIN: Ferritin: 6 ng/mL — ABNORMAL LOW (ref 11–307)

## 2017-10-18 LAB — PROTEIN S PANEL
Protein S Activity: 56 % — ABNORMAL LOW (ref 63–140)
Protein S Ag, Free: 63 % (ref 57–157)
Protein S Ag, Total: 77 % (ref 60–150)

## 2017-10-18 NOTE — Progress Notes (Signed)
Hematology and Oncology Follow Up Visit  Jean Garcia [id number] 02/24/1976 42 y.o. 10/18/2017   Principle Diagnosis:   2nd trimester pregnancy -- multiple miscarriages  Protein S deficiency  Iron deficiency  Current Therapy:    Lovenox 40 mg sq q day  IV iron as needed     Interim History:  Jean Garcia is back for her second office visit.  I first saw her back in late June.  At that time, she came in because of multiple miscarriages.  She was pregnant.  She was only on it her pregnancy.  I felt that prophylactic Lovenox would be reasonable for her.  She is doing well on Lovenox.  She does not have any bleeding.  She is had no leg swelling.  She does feel tired.  We did do iron studies on her today.  She is markedly iron deficient.  Her ferritin is 6 with iron saturation of 3%.  She is already taking prenatal vitamins with iron.  We will have to see about getting her set up for IV iron.  So far, her baby is doing well.  Again, she is at 16 weeks.  She will find out if it is a boy or girl in the near future.  She is had no nausea or vomiting.  She has had no cough or shortness of breath.  She has had no rashes.  Overall, her performance status is ECOG 1.  Medications:  Current Outpatient Medications:  .  butalbital-acetaminophen-caffeine (FIORICET, ESGIC) 50-325-40 MG tablet, Take 1-2 tablets by mouth every 6 (six) hours as needed for headache., Disp: 30 tablet, Rfl: 0 .  clotrimazole-betamethasone (LOTRISONE) cream, Apply 1 application topically 2 (two) times daily as needed (eczema). , Disp: , Rfl:  .  cyclobenzaprine (FLEXERIL) 10 MG tablet, Take 1 tablet (10 mg total) by mouth at bedtime., Disp: 30 tablet, Rfl: 0 .  enoxaparin (LOVENOX) 40 MG/0.4ML injection, Inject 0.4 mLs (40 mg total) into the skin daily., Disp: 30 Syringe, Rfl: 9 .  levofloxacin (LEVAQUIN) 750 MG tablet, Take 1 tablet (750 mg total) by mouth daily., Disp: 7 tablet, Rfl: 0 .  topiramate (TOPAMAX)  25 MG tablet, Take 1 tab po qhs x 1 wk, then 1 po bid x 1 wk, then 1 po qam and 2 po qhs x 1 wk, then 2 po bid, Disp: 120 tablet, Rfl: 0 .  topiramate (TOPAMAX) 50 MG tablet, Take 1 tablet (50 mg total) by mouth 2 (two) times daily. **FOLLOW-UP OFFICE VISIT NEEDED FOR REFILLS**, Disp: 60 tablet, Rfl: 1  Allergies:  Allergies  Allergen Reactions  . Ibuprofen     sensitivity    Past Medical History, Surgical history, Social history, and Family History were reviewed and updated.  Review of Systems: Review of Systems  Constitutional: Positive for fatigue.  HENT:  Negative.   Eyes: Negative.   Respiratory: Negative.   Cardiovascular: Negative.   Gastrointestinal: Positive for nausea.  Endocrine: Negative.   Genitourinary: Positive for frequency.   Musculoskeletal: Negative.   Skin: Negative.   Neurological: Negative.   Hematological: Negative.   Psychiatric/Behavioral: Negative.     Physical Exam:  weight is 182 lb (82.6 kg). Her oral temperature is 98.4 F (36.9 C). Her blood pressure is 150/88 (abnormal) and her pulse is 77. Her respiration is 16 and oxygen saturation is 100%.   Wt Readings from Last 3 Encounters:  10/17/17 182 lb (82.6 kg)  09/16/17 180 lb 12 oz (82 kg)  05/24/17 179  lb 3.2 oz (81.3 kg)    Physical Exam  Constitutional: She is oriented to person, place, and time.  This is a pregnant African-American female in no obvious distress.  HENT:  Head: Normocephalic and atraumatic.  Mouth/Throat: Oropharynx is clear and moist.  Eyes: Pupils are equal, round, and reactive to light. EOM are normal.  Neck: Normal range of motion.  Cardiovascular: Normal rate, regular rhythm and normal heart sounds.  Pulmonary/Chest: Effort normal and breath sounds normal.  Abdominal: Soft. Bowel sounds are normal.  Musculoskeletal: Normal range of motion. She exhibits no edema, tenderness or deformity.  Lymphadenopathy:    She has no cervical adenopathy.  Neurological: She is  alert and oriented to person, place, and time.  Skin: Skin is warm and dry. No rash noted. No erythema.  Psychiatric: She has a normal mood and affect. Her behavior is normal. Judgment and thought content normal.  Vitals reviewed.    Lab Results  Component Value Date   WBC 11.4 (H) 10/17/2017   HGB 8.6 (L) 10/17/2017   HCT 27.8 (L) 10/17/2017   MCV 75.1 (L) 10/17/2017   PLT 522 (H) 10/17/2017     Chemistry      Component Value Date/Time   NA 136 10/17/2017 1429   NA 145 (H) 05/19/2017 1536   K 3.8 10/17/2017 1429   CL 105 10/17/2017 1429   CO2 23 10/17/2017 1429   BUN 9 10/17/2017 1429   BUN 8 05/19/2017 1536   CREATININE 0.50 10/17/2017 1429      Component Value Date/Time   CALCIUM 8.9 10/17/2017 1429   ALKPHOS 43 10/17/2017 1429   AST 18 10/17/2017 1429   ALT 19 10/17/2017 1429   BILITOT 0.2 (L) 10/17/2017 1429   BILITOT 0.2 05/19/2017 1536       Impression and Plan: Jean Garcia is a 42 year old Afro-American female with her ninth pregnancy.  She has 5 children.  She has had multiple miscarriages.  She is on Lovenox.  We will keep her on Lovenox.  She is due in January.  I would keep her on Lovenox as long as possible.  I do not think we have to get her onto heparin.  I think once we get into the delivery day, we should be good just to stop the Lovenox.  I will have to see what her Protein S levels are.  We have to figure out how to give her the IV iron.  I think this will had to be done at [facility].  I would like to see her back in another 6 weeks or so.  I want to make sure that we work on her anemia so that this is not going to be another issue with her pregnancy.   Volanda Napoleon, MD 7/27/201911:55 AM

## 2017-10-24 ENCOUNTER — Other Ambulatory Visit: Payer: Self-pay | Admitting: Obstetrics and Gynecology

## 2017-10-27 ENCOUNTER — Ambulatory Visit (HOSPITAL_COMMUNITY)
Admission: RE | Admit: 2017-10-27 | Discharge: 2017-10-27 | Disposition: A | Discharge status: Home / Self Care | Payer: BLUE CROSS/BLUE SHIELD | Source: Ambulatory Visit | Attending: Obstetrics and Gynecology | Admitting: Obstetrics and Gynecology

## 2017-10-27 DIAGNOSIS — Z3A00 Weeks of gestation of pregnancy not specified: Secondary | ICD-10-CM | POA: Insufficient documentation

## 2017-10-27 DIAGNOSIS — D509 Iron deficiency anemia, unspecified: Secondary | ICD-10-CM | POA: Diagnosis not present

## 2017-10-27 DIAGNOSIS — O99019 Anemia complicating pregnancy, unspecified trimester: Secondary | ICD-10-CM | POA: Diagnosis not present

## 2017-10-27 MED ORDER — SODIUM CHLORIDE 0.9 % IV SOLN
Freq: Once | INTRAVENOUS | Status: AC
  Administered 2017-10-27: 11:00:00 via INTRAVENOUS

## 2017-10-27 MED ORDER — SODIUM CHLORIDE 0.9 % IV SOLN
510.0000 mg | INTRAVENOUS | Status: DC
  Administered 2017-10-27: 510 mg via INTRAVENOUS
  Filled 2017-10-27: qty 17

## 2017-10-27 NOTE — Progress Notes (Signed)
PATIENT CARE CENTER NOTE  Diagnosis: Iron Deficiency Anemia    Provider: Dr. Garwin Brothers   Procedure: IV Feraheme    Note: Patient received infusion of Feraheme. Patient tolerated infusion well with no adverse reaction. Monitored patient for 30 minutes post infusion. Vital signs stable. Discharge instructions given. Patient alert, oriented and ambulatory at discharge.

## 2017-10-27 NOTE — Discharge Instructions (Signed)

## 2017-11-03 ENCOUNTER — Ambulatory Visit (HOSPITAL_COMMUNITY)
Admission: RE | Admit: 2017-11-03 | Discharge: 2017-11-03 | Disposition: A | Discharge status: Home / Self Care | Payer: BLUE CROSS/BLUE SHIELD | Source: Ambulatory Visit | Attending: Obstetrics and Gynecology | Admitting: Obstetrics and Gynecology

## 2017-11-03 DIAGNOSIS — O99019 Anemia complicating pregnancy, unspecified trimester: Secondary | ICD-10-CM | POA: Diagnosis not present

## 2017-11-03 MED ORDER — SODIUM CHLORIDE 0.9 % IV SOLN
INTRAVENOUS | Status: DC
  Administered 2017-11-03: 13:00:00 via INTRAVENOUS

## 2017-11-03 MED ORDER — SODIUM CHLORIDE 0.9 % IV SOLN
510.0000 mg | Freq: Once | INTRAVENOUS | Status: AC
  Administered 2017-11-03: 510 mg via INTRAVENOUS
  Filled 2017-11-03: qty 17

## 2017-11-03 NOTE — Discharge Instructions (Signed)

## 2017-11-06 ENCOUNTER — Other Ambulatory Visit (HOSPITAL_COMMUNITY): Payer: Self-pay | Admitting: Obstetrics and Gynecology

## 2017-11-06 DIAGNOSIS — Z3689 Encounter for other specified antenatal screening: Secondary | ICD-10-CM

## 2017-11-06 DIAGNOSIS — Z3A19 19 weeks gestation of pregnancy: Secondary | ICD-10-CM

## 2017-11-06 DIAGNOSIS — O280 Abnormal hematological finding on antenatal screening of mother: Secondary | ICD-10-CM

## 2017-11-13 ENCOUNTER — Encounter (HOSPITAL_COMMUNITY): Payer: Self-pay | Admitting: *Deleted

## 2017-11-17 ENCOUNTER — Other Ambulatory Visit (HOSPITAL_COMMUNITY): Payer: Self-pay | Admitting: Obstetrics and Gynecology

## 2017-11-17 ENCOUNTER — Ambulatory Visit (HOSPITAL_COMMUNITY)
Admission: RE | Admit: 2017-11-17 | Discharge: 2017-11-17 | Disposition: A | Discharge status: Home / Self Care | Payer: BLUE CROSS/BLUE SHIELD | Source: Ambulatory Visit | Attending: Obstetrics and Gynecology | Admitting: Obstetrics and Gynecology

## 2017-11-17 ENCOUNTER — Other Ambulatory Visit (HOSPITAL_COMMUNITY): Payer: Self-pay | Admitting: *Deleted

## 2017-11-17 ENCOUNTER — Other Ambulatory Visit (HOSPITAL_COMMUNITY): Payer: Self-pay

## 2017-11-17 ENCOUNTER — Encounter (HOSPITAL_COMMUNITY): Payer: Self-pay

## 2017-11-17 ENCOUNTER — Other Ambulatory Visit: Payer: Self-pay

## 2017-11-17 DIAGNOSIS — Z3A19 19 weeks gestation of pregnancy: Secondary | ICD-10-CM

## 2017-11-17 DIAGNOSIS — O280 Abnormal hematological finding on antenatal screening of mother: Secondary | ICD-10-CM | POA: Insufficient documentation

## 2017-11-17 DIAGNOSIS — O09522 Supervision of elderly multigravida, second trimester: Secondary | ICD-10-CM | POA: Insufficient documentation

## 2017-11-17 DIAGNOSIS — O99842 Bariatric surgery status complicating pregnancy, second trimester: Secondary | ICD-10-CM | POA: Diagnosis not present

## 2017-11-17 DIAGNOSIS — O99119 Other diseases of the blood and blood-forming organs and certain disorders involving the immune mechanism complicating pregnancy, unspecified trimester: Secondary | ICD-10-CM

## 2017-11-17 DIAGNOSIS — D6859 Other primary thrombophilia: Secondary | ICD-10-CM | POA: Insufficient documentation

## 2017-11-17 DIAGNOSIS — O99212 Obesity complicating pregnancy, second trimester: Secondary | ICD-10-CM | POA: Diagnosis not present

## 2017-11-17 DIAGNOSIS — O09292 Supervision of pregnancy with other poor reproductive or obstetric history, second trimester: Secondary | ICD-10-CM

## 2017-11-17 DIAGNOSIS — Z363 Encounter for antenatal screening for malformations: Secondary | ICD-10-CM

## 2017-11-17 DIAGNOSIS — O99112 Other diseases of the blood and blood-forming organs and certain disorders involving the immune mechanism complicating pregnancy, second trimester: Secondary | ICD-10-CM | POA: Insufficient documentation

## 2017-11-17 DIAGNOSIS — Z3689 Encounter for other specified antenatal screening: Secondary | ICD-10-CM

## 2017-11-17 DIAGNOSIS — O09529 Supervision of elderly multigravida, unspecified trimester: Secondary | ICD-10-CM

## 2017-11-17 DIAGNOSIS — O09299 Supervision of pregnancy with other poor reproductive or obstetric history, unspecified trimester: Secondary | ICD-10-CM

## 2017-11-17 NOTE — ED Notes (Signed)
Patient in session with Dietitian.

## 2017-11-21 MED FILL — ENOXAPARIN 40 MG/0.4 ML SYR: 40 | 30 days supply | Qty: 12 | Fill #1

## 2017-11-25 ENCOUNTER — Telehealth (HOSPITAL_COMMUNITY): Payer: Self-pay | Admitting: *Deleted

## 2017-11-25 NOTE — Telephone Encounter (Signed)
Pt calling for result of maternity21.  Name and DOB verified.  Negative result given, female fetus.  Pt voiced understanding.

## 2017-11-26 ENCOUNTER — Encounter (HOSPITAL_COMMUNITY): Payer: Self-pay

## 2017-11-28 ENCOUNTER — Other Ambulatory Visit: Payer: Self-pay

## 2017-11-28 ENCOUNTER — Inpatient Hospital Stay: Payer: BLUE CROSS/BLUE SHIELD | Attending: Hematology & Oncology | Admitting: Hematology & Oncology

## 2017-11-28 ENCOUNTER — Encounter: Payer: Self-pay | Admitting: Hematology & Oncology

## 2017-11-28 ENCOUNTER — Inpatient Hospital Stay: Payer: BLUE CROSS/BLUE SHIELD

## 2017-11-28 VITALS — BP 143/72 | HR 71 | Temp 98.6°F | Resp 16 | Wt 184.0 lb

## 2017-11-28 DIAGNOSIS — O99112 Other diseases of the blood and blood-forming organs and certain disorders involving the immune mechanism complicating pregnancy, second trimester: Secondary | ICD-10-CM | POA: Diagnosis not present

## 2017-11-28 DIAGNOSIS — D500 Iron deficiency anemia secondary to blood loss (chronic): Secondary | ICD-10-CM

## 2017-11-28 DIAGNOSIS — Z7901 Long term (current) use of anticoagulants: Secondary | ICD-10-CM | POA: Insufficient documentation

## 2017-11-28 DIAGNOSIS — O99012 Anemia complicating pregnancy, second trimester: Secondary | ICD-10-CM | POA: Diagnosis not present

## 2017-11-28 DIAGNOSIS — D6859 Other primary thrombophilia: Secondary | ICD-10-CM | POA: Insufficient documentation

## 2017-11-28 LAB — CBC WITH DIFFERENTIAL (CANCER CENTER ONLY)
Basophils Absolute: 0 10*3/uL (ref 0.0–0.1)
Basophils Relative: 0 %
Eosinophils Absolute: 0.1 10*3/uL (ref 0.0–0.5)
Eosinophils Relative: 1 %
HCT: 31.8 % — ABNORMAL LOW (ref 34.8–46.6)
Hemoglobin: 10.1 g/dL — ABNORMAL LOW (ref 11.6–15.9)
Lymphocytes Relative: 31 %
Lymphs Abs: 2.6 10*3/uL (ref 0.9–3.3)
MCH: 26.4 pg (ref 26.0–34.0)
MCHC: 31.8 g/dL — ABNORMAL LOW (ref 32.0–36.0)
MCV: 83.2 fL (ref 81.0–101.0)
Monocytes Absolute: 0.9 10*3/uL (ref 0.1–0.9)
Monocytes Relative: 11 %
Neutro Abs: 4.7 10*3/uL (ref 1.5–6.5)
Neutrophils Relative %: 57 %
Platelet Count: 340 10*3/uL (ref 145–400)
RBC: 3.82 MIL/uL (ref 3.70–5.32)
WBC Count: 8.4 10*3/uL (ref 3.9–10.0)

## 2017-11-28 LAB — CMP (CANCER CENTER ONLY)
ALT: 12 U/L (ref 0–44)
AST: 16 U/L (ref 15–41)
Albumin: 2.9 g/dL — ABNORMAL LOW (ref 3.5–5.0)
Alkaline Phosphatase: 46 U/L (ref 38–126)
Anion gap: 10 (ref 5–15)
BUN: 5 mg/dL — ABNORMAL LOW (ref 6–20)
CO2: 24 mmol/L (ref 22–32)
Calcium: 9.1 mg/dL (ref 8.9–10.3)
Chloride: 104 mmol/L (ref 98–111)
Creatinine: 0.58 mg/dL (ref 0.44–1.00)
GFR, Est AFR Am: >60 mL/min (ref >60)
GFR, Estimated: >60 mL/min (ref >60)
Glucose, Bld: 84 mg/dL (ref 70–99)
Potassium: 3.2 mmol/L — ABNORMAL LOW (ref 3.5–5.1)
Sodium: 138 mmol/L (ref 135–145)
Total Bilirubin: 0.2 mg/dL — ABNORMAL LOW (ref 0.3–1.2)
Total Protein: 7.2 g/dL (ref 6.5–8.1)

## 2017-11-28 LAB — RETICULOCYTES
RBC.: 3.82 MIL/uL (ref 3.70–5.45)
Retic Count, Absolute: 68.8 10*3/uL (ref 33.7–90.7)
Retic Ct Pct: 1.8 % (ref 0.7–2.1)

## 2017-11-28 NOTE — Progress Notes (Signed)
Hematology and Oncology Follow Up Visit  Jean Garcia [id number] Jul 23, 1975 42 y.o. 11/28/2017   Principle Diagnosis:   2nd trimester pregnancy -- multiple miscarriages  Protein S deficiency  Iron deficiency  Current Therapy:    Lovenox 40 mg sq q day  IV iron as needed -- Feraheme given 11/03/2017     Interim History:  Jean Garcia is back for for follow-up.  We did go ahead and give her a dose of iron.  We last saw her back in July, her ferritin was 6 and iron saturation was only 3.  She received iron without any difficulties.  However, I suspect that she probably will need another dose.  Her hemoglobin improved quite nicely.  Her hemoglobin went from 8.6 up to 10.1.  Her platelet count went from 522,000 down to 340,000.  She is feeling better.  She has more energy.  Para she now knows she is going to have a baby boy.  This will be the fifth boy that she has.  Her due date is going to be January 19.  She is eating well.  She is having no problems with bowels or bladder.  She does have a little bit of nausea.  She has occasional vomiting.  She takes some over-the-counter medicine for this.    she is doing well with the Lovenox.  So far there is been no problems with bleeding.  Overall, her performance status is ECOG 1.    Medications:  Current Outpatient Medications:  .  clotrimazole-betamethasone (LOTRISONE) cream, Apply 1 application topically 2 (two) times daily as needed (eczema). , Disp: , Rfl:  .  enoxaparin (LOVENOX) 40 MG/0.4ML injection, Inject 0.4 mLs (40 mg total) into the skin daily., Disp: 30 Syringe, Rfl: 9 .  norethindrone-ethinyl estradiol (MICROGESTIN) 1-20 MG-MCG tablet, Microgestin 1/20 (21) 1 mg-20 mcg tablet  TK 1 T PO DAILY FOR 21 DAYS THEN IMMEDIATELY START A NEW PACK, Disp: , Rfl:  .  Prenatal Vit-Fe Fumarate-FA (PRENATAL VITAMIN PO), Take by mouth., Disp: , Rfl:   Allergies:  Allergies  Allergen Reactions  . Ibuprofen     sensitivity    Past  Medical History, Surgical history, Social history, and Family History were reviewed and updated.  Review of Systems: Review of Systems  Constitutional: Positive for fatigue.  HENT:  Negative.   Eyes: Negative.   Respiratory: Negative.   Cardiovascular: Negative.   Gastrointestinal: Positive for nausea.  Endocrine: Negative.   Genitourinary: Positive for frequency.   Musculoskeletal: Negative.   Skin: Negative.   Neurological: Negative.   Hematological: Negative.   Psychiatric/Behavioral: Negative.     Physical Exam:  weight is 184 lb (83.5 kg). Her oral temperature is 98.6 F (37 C). Her blood pressure is 143/72 (abnormal) and her pulse is 71. Her respiration is 16 and oxygen saturation is 100%.   Wt Readings from Last 3 Encounters:  11/28/17 184 lb (83.5 kg)  11/17/17 181 lb 9.6 oz (82.4 kg)  10/17/17 182 lb (82.6 kg)    Physical Exam  Constitutional: She is oriented to person, place, and time.  This is a pregnant African-American female in no obvious distress.  HENT:  Head: Normocephalic and atraumatic.  Mouth/Throat: Oropharynx is clear and moist.  Eyes: Pupils are equal, round, and reactive to light. EOM are normal.  Neck: Normal range of motion.  Cardiovascular: Normal rate, regular rhythm and normal heart sounds.  Pulmonary/Chest: Effort normal and breath sounds normal.  Abdominal: Soft. Bowel sounds are normal.  Musculoskeletal: Normal range of motion. She exhibits no edema, tenderness or deformity.  Lymphadenopathy:    She has no cervical adenopathy.  Neurological: She is alert and oriented to person, place, and time.  Skin: Skin is warm and dry. No rash noted. No erythema.  Psychiatric: She has a normal mood and affect. Her behavior is normal. Judgment and thought content normal.  Vitals reviewed.    Lab Results  Component Value Date   WBC 8.4 11/28/2017   HGB 10.1 (L) 11/28/2017   HCT 31.8 (L) 11/28/2017   MCV 83.2 11/28/2017   PLT 340 11/28/2017      Chemistry      Component Value Date/Time   NA 136 10/17/2017 1429   NA 145 (H) 05/19/2017 1536   K 3.8 10/17/2017 1429   CL 105 10/17/2017 1429   CO2 23 10/17/2017 1429   BUN 9 10/17/2017 1429   BUN 8 05/19/2017 1536   CREATININE 0.50 10/17/2017 1429      Component Value Date/Time   CALCIUM 8.9 10/17/2017 1429   ALKPHOS 43 10/17/2017 1429   AST 18 10/17/2017 1429   ALT 19 10/17/2017 1429   BILITOT 0.2 (L) 10/17/2017 1429   BILITOT 0.2 05/19/2017 1536       Impression and Plan: Jean Garcia is a 42 year old Afro-American female with her ninth pregnancy.  She has 5 children.  She has had multiple miscarriages.  So far, her pregnancy is going quite well.  The baby seems to be doing nicely.  I want to see her back in 2 months.  We will see what her iron levels are.  Again I suspect that we might have to do another dose of iron to make sure that she is not anemic when she has her delivery.   Volanda Napoleon, MD 9/6/20192:16 PM

## 2017-12-01 ENCOUNTER — Encounter: Payer: Self-pay | Admitting: *Deleted

## 2017-12-01 LAB — FERRITIN: Ferritin: 213 ng/mL (ref 11–307)

## 2017-12-01 LAB — IRON AND TIBC
Iron: 70 ug/dL (ref 41–142)
Saturation Ratios: 26 % (ref 21–57)
TIBC: 273 ug/dL (ref 236–444)
UIBC: 203 ug/dL

## 2017-12-06 ENCOUNTER — Inpatient Hospital Stay (HOSPITAL_COMMUNITY)
Admission: AD | Admit: 2017-12-06 | Discharge: 2017-12-09 | DRG: 832 | Disposition: A | Discharge status: Home / Self Care | Payer: BLUE CROSS/BLUE SHIELD | Attending: Obstetrics and Gynecology | Admitting: Obstetrics and Gynecology

## 2017-12-06 ENCOUNTER — Encounter (HOSPITAL_COMMUNITY): Payer: Self-pay | Admitting: *Deleted

## 2017-12-06 ENCOUNTER — Other Ambulatory Visit: Payer: Self-pay

## 2017-12-06 DIAGNOSIS — O10012 Pre-existing essential hypertension complicating pregnancy, second trimester: Principal | ICD-10-CM | POA: Diagnosis present

## 2017-12-06 DIAGNOSIS — O10912 Unspecified pre-existing hypertension complicating pregnancy, second trimester: Secondary | ICD-10-CM | POA: Diagnosis present

## 2017-12-06 DIAGNOSIS — O09522 Supervision of elderly multigravida, second trimester: Secondary | ICD-10-CM

## 2017-12-06 DIAGNOSIS — O1410 Severe pre-eclampsia, unspecified trimester: Secondary | ICD-10-CM | POA: Diagnosis present

## 2017-12-06 DIAGNOSIS — Z3A21 21 weeks gestation of pregnancy: Secondary | ICD-10-CM

## 2017-12-06 DIAGNOSIS — O99112 Other diseases of the blood and blood-forming organs and certain disorders involving the immune mechanism complicating pregnancy, second trimester: Secondary | ICD-10-CM | POA: Diagnosis present

## 2017-12-06 DIAGNOSIS — R030 Elevated blood-pressure reading, without diagnosis of hypertension: Secondary | ICD-10-CM | POA: Diagnosis present

## 2017-12-06 DIAGNOSIS — O1412 Severe pre-eclampsia, second trimester: Secondary | ICD-10-CM | POA: Diagnosis not present

## 2017-12-06 DIAGNOSIS — O99842 Bariatric surgery status complicating pregnancy, second trimester: Secondary | ICD-10-CM | POA: Diagnosis present

## 2017-12-06 DIAGNOSIS — D6859 Other primary thrombophilia: Secondary | ICD-10-CM | POA: Diagnosis present

## 2017-12-06 HISTORY — DX: Other primary thrombophilia: D68.59

## 2017-12-06 LAB — CBC WITH DIFFERENTIAL/PLATELET
Basophils Absolute: 0 10*3/uL (ref 0.0–0.1)
Basophils Relative: 0 %
Eosinophils Absolute: 0.1 10*3/uL (ref 0.0–0.7)
Eosinophils Relative: 2 %
HCT: 33.3 % — ABNORMAL LOW (ref 36.0–46.0)
Hemoglobin: 10.7 g/dL — ABNORMAL LOW (ref 12.0–15.0)
Lymphocytes Relative: 37 %
Lymphs Abs: 3.4 10*3/uL (ref 0.7–4.0)
MCH: 27 pg (ref 26.0–34.0)
MCHC: 32.1 g/dL (ref 30.0–36.0)
MCV: 83.9 fL (ref 78.0–100.0)
Monocytes Absolute: 0.4 10*3/uL (ref 0.1–1.0)
Monocytes Relative: 5 %
Neutro Abs: 5.2 10*3/uL (ref 1.7–7.7)
Neutrophils Relative %: 56 %
Platelets: 363 10*3/uL (ref 150–400)
RBC: 3.97 MIL/uL (ref 3.87–5.11)
WBC: 9.2 10*3/uL (ref 4.0–10.5)

## 2017-12-06 LAB — URINALYSIS, ROUTINE W REFLEX MICROSCOPIC
Bilirubin Urine: NEGATIVE
Glucose, UA: NEGATIVE mg/dL
Ketones, ur: NEGATIVE mg/dL
Leukocytes, UA: NEGATIVE
Nitrite: NEGATIVE
Protein, ur: NEGATIVE mg/dL
Specific Gravity, Urine: 1.027 (ref 1.005–1.030)
pH: 5 (ref 5.0–8.0)

## 2017-12-06 LAB — COMPREHENSIVE METABOLIC PANEL
ALT: 17 U/L (ref 0–44)
AST: 19 U/L (ref 15–41)
Albumin: 3.2 g/dL — ABNORMAL LOW (ref 3.5–5.0)
Alkaline Phosphatase: 42 U/L (ref 38–126)
Anion gap: 9 (ref 5–15)
BUN: 8 mg/dL (ref 6–20)
CO2: 22 mmol/L (ref 22–32)
Calcium: 8.8 mg/dL — ABNORMAL LOW (ref 8.9–10.3)
Chloride: 105 mmol/L (ref 98–111)
Creatinine, Ser: 0.41 mg/dL — ABNORMAL LOW (ref 0.44–1.00)
GFR calc Af Amer: >60 mL/min (ref >60)
GFR calc non Af Amer: >60 mL/min (ref >60)
Glucose, Bld: 87 mg/dL (ref 70–99)
Potassium: 3.8 mmol/L (ref 3.5–5.1)
Sodium: 136 mmol/L (ref 135–145)
Total Bilirubin: 0.5 mg/dL (ref 0.3–1.2)
Total Protein: 7.1 g/dL (ref 6.5–8.1)

## 2017-12-06 LAB — PROTEIN / CREATININE RATIO, URINE
Creatinine, Urine: 182 mg/dL
Protein Creatinine Ratio: 0.08 mg/mg{Cre} (ref 0.00–0.15)
Total Protein, Urine: 14 mg/dL

## 2017-12-06 LAB — TYPE AND SCREEN
ABO/RH(D): A POS
Antibody Screen: NEGATIVE

## 2017-12-06 MED ORDER — MAGNESIUM SULFATE 40 G IN LACTATED RINGERS - SIMPLE
2.0000 g/h | INTRAVENOUS | Status: AC
  Administered 2017-12-06 – 2017-12-07 (×2): 2 g/h via INTRAVENOUS
  Filled 2017-12-06 (×2): qty 500

## 2017-12-06 MED ORDER — CALCIUM CARBONATE ANTACID 500 MG PO CHEW: 2.0000 | CHEWABLE_TABLET | ORAL | Status: DC | PRN

## 2017-12-06 MED ORDER — MAGNESIUM SULFATE BOLUS VIA INFUSION
4.0000 g | Freq: Once | INTRAVENOUS | Status: AC
  Administered 2017-12-06: 4 g via INTRAVENOUS
  Filled 2017-12-06: qty 500

## 2017-12-06 MED ORDER — NIFEDIPINE ER OSMOTIC RELEASE 30 MG PO TB24
30.0000 mg | ORAL_TABLET | Freq: Once | ORAL | Status: AC
  Administered 2017-12-06: 30 mg via ORAL
  Filled 2017-12-06: qty 1

## 2017-12-06 MED ORDER — HYDRALAZINE HCL 20 MG/ML IJ SOLN
10.0000 mg | INTRAMUSCULAR | Status: DC | PRN
  Administered 2017-12-06: 10 mg via INTRAVENOUS
  Filled 2017-12-06: qty 1

## 2017-12-06 MED ORDER — ZOLPIDEM TARTRATE 5 MG PO TABS: 5.0000 mg | ORAL_TABLET | Freq: Every evening | ORAL | Status: DC | PRN

## 2017-12-06 MED ORDER — LABETALOL HCL 5 MG/ML IV SOLN
40.0000 mg | INTRAVENOUS | Status: DC | PRN
  Administered 2017-12-06: 40 mg via INTRAVENOUS
  Filled 2017-12-06: qty 8

## 2017-12-06 MED ORDER — DOCUSATE SODIUM 100 MG PO CAPS
100.0000 mg | ORAL_CAPSULE | Freq: Every day | ORAL | Status: DC
  Administered 2017-12-06 – 2017-12-09 (×4): 100 mg via ORAL
  Filled 2017-12-06 (×6): qty 1

## 2017-12-06 MED ORDER — LABETALOL HCL 5 MG/ML IV SOLN
80.0000 mg | INTRAVENOUS | Status: DC | PRN
  Administered 2017-12-06: 80 mg via INTRAVENOUS
  Filled 2017-12-06: qty 16

## 2017-12-06 MED ORDER — PRENATAL MULTIVITAMIN CH
1.0000 | ORAL_TABLET | Freq: Every day | ORAL | Status: DC
  Administered 2017-12-07 – 2017-12-08 (×2): 1 via ORAL
  Filled 2017-12-06 (×3): qty 1

## 2017-12-06 MED ORDER — HYDRALAZINE HCL 20 MG/ML IJ SOLN: 10.0000 mg | INTRAMUSCULAR | Status: DC | PRN

## 2017-12-06 MED ORDER — ACETAMINOPHEN 325 MG PO TABS
650.0000 mg | ORAL_TABLET | ORAL | Status: DC | PRN
  Administered 2017-12-07 – 2017-12-08 (×4): 650 mg via ORAL
  Filled 2017-12-06 (×4): qty 2

## 2017-12-06 MED ORDER — NIFEDIPINE 10 MG PO CAPS
10.0000 mg | ORAL_CAPSULE | Freq: Three times a day (TID) | ORAL | Status: DC
  Administered 2017-12-06 – 2017-12-07 (×2): 10 mg via ORAL
  Filled 2017-12-06 (×2): qty 1

## 2017-12-06 MED ORDER — LABETALOL HCL 5 MG/ML IV SOLN: 20.0000 mg | INTRAVENOUS | Status: DC | PRN

## 2017-12-06 MED ORDER — LABETALOL HCL 5 MG/ML IV SOLN: 40.0000 mg | INTRAVENOUS | Status: DC | PRN

## 2017-12-06 MED ORDER — NIFEDIPINE ER OSMOTIC RELEASE 30 MG PO TB24
30.0000 mg | ORAL_TABLET | Freq: Every day | ORAL | Status: DC
  Administered 2017-12-07 – 2017-12-09 (×3): 30 mg via ORAL
  Filled 2017-12-06 (×3): qty 1

## 2017-12-06 MED ORDER — LABETALOL HCL 5 MG/ML IV SOLN
20.0000 mg | INTRAVENOUS | Status: DC | PRN
  Administered 2017-12-06: 20 mg via INTRAVENOUS
  Filled 2017-12-06: qty 4

## 2017-12-06 MED ORDER — LACTATED RINGERS IV SOLN
INTRAVENOUS | Status: DC
  Administered 2017-12-06 – 2017-12-08 (×5): via INTRAVENOUS

## 2017-12-06 MED ORDER — LABETALOL HCL 5 MG/ML IV SOLN: 80.0000 mg | INTRAVENOUS | Status: DC | PRN

## 2017-12-06 NOTE — MAU Provider Note (Signed)
History     CSN: [id number]  Arrival date and time: 12/06/17 1845   First Provider Initiated Contact with Patient 12/06/17 1922      Chief Complaint  Patient presents with  . Headache  . Abdominal Pain  . Hypertension   42 yo AAF G8P5 presents at 36 w 6d with headache and ? Elevated BP at home. Mild headache unrelieved with Tylenol No visual changes or epigastric pain. No history of HTN. History of PEC. Protein S deficiency on Lovenox. Good FM. No LOF or bleeding. Nl MFM sono 2w ago. History of abnl quad screen.  Headache   Associated symptoms include abdominal pain. Pertinent negatives include no fever, nausea or vomiting. Her past medical history is significant for hypertension.  Abdominal Pain  Associated symptoms include headaches. Pertinent negatives include no constipation, diarrhea, fever, nausea or vomiting.  Hypertension  Associated symptoms include headaches.    Ms. GEORGINE WILTSE is a 42 y.o. [id number] at [redacted]w[redacted]d who presents to MAU today with complaint of elevated blood pressure and headache. The patient denies CHTN history. She states last pregnancy in 2012 she had eclampsia. She states BP has been slowly rising in the office of the last couple of visits, but had only been mildly elevated in the 140z/90s. She has had a headache x 2 days. She took BP at home and it was 160s/90s. She denies RUQ abdominal pain or edema. She also denies bleeding, LOF or contractions.    OB History    Gravida  8   Para  5   Term  5   Preterm      AB  2   Living  5     SAB  2   TAB      Ectopic      Multiple      Live Births              Past Medical History:  Diagnosis Date  . Hypertension   . Protein S deficiency [facility])     Past Surgical History:  Procedure Laterality Date  . NO PAST SURGERIES      Family History  Problem Relation Age of Onset  . Alcohol abuse Neg Hx   . Anxiety disorder Neg Hx   . Bipolar disorder Neg Hx   . Depression Neg Hx   . Drug  abuse Neg Hx     Social History   Tobacco Use  . Smoking status: Never Smoker  . Smokeless tobacco: Never Used  Substance Use Topics  . Alcohol use: No  . Drug use: No    Allergies:  Allergies  Allergen Reactions  . Ibuprofen Other (See Comments)    sensitivity    Medications Prior to Admission  Medication Sig Dispense Refill Last Dose  . enoxaparin (LOVENOX) 40 MG/0.4ML injection Inject 0.4 mLs (40 mg total) into the skin daily. 30 Syringe 9 12/06/2017 at Unknown time  . Prenatal Vit-Fe Fumarate-FA (PRENATAL VITAMIN PO) Take by mouth.   12/05/2017 at Unknown time  . clotrimazole-betamethasone (LOTRISONE) cream Apply 1 application topically 2 (two) times daily as needed (eczema).    More than a month at Unknown time  . norethindrone-ethinyl estradiol (MICROGESTIN) 1-20 MG-MCG tablet Microgestin 1/20 (21) 1 mg-20 mcg tablet  TK 1 T PO DAILY FOR 21 DAYS THEN IMMEDIATELY START A NEW PACK       Review of Systems  Constitutional: Negative for fever.  Eyes: Positive for visual disturbance.  Cardiovascular: Negative for leg swelling.  Gastrointestinal: Positive for abdominal pain. Negative for constipation, diarrhea, nausea and vomiting.  Neurological: Positive for headaches.   Physical Exam   Blood pressure (!) 151/74, pulse 94, temperature 99 F (37.2 C), temperature source Oral, resp. rate 17, height 5\' 1"  (1.549 m), weight 83.5 kg, last menstrual period 07/06/2017, SpO2 100 %.  Physical Exam  Nursing note and vitals reviewed. Constitutional: She is oriented to person, place, and time. She appears well-developed and well-nourished. No distress.  HENT:  Head: Normocephalic and atraumatic.  Cardiovascular: Normal rate.  Respiratory: Effort normal.  GI: Soft. She exhibits no distension and no mass. There is no tenderness. There is no rebound and no guarding.  Musculoskeletal: She exhibits no edema.  Neurological: She is alert and oriented to person, place, and time. She has  normal reflexes.  No clonus  Skin: Skin is warm and dry. No erythema.  Psychiatric: She has a normal mood and affect.    Results for orders placed or performed during the hospital encounter of 12/06/17 (from the past 24 hour(s))  CBC with Differential/Platelet     Status: Abnormal   Collection Time: 12/06/17  7:51 PM  Result Value Ref Range   WBC 9.2 4.0 - 10.5 K/uL   RBC 3.97 3.87 - 5.11 MIL/uL   Hemoglobin 10.7 (L) 12.0 - 15.0 g/dL   HCT 33.3 (L) 36.0 - 46.0 %   MCV 83.9 78.0 - 100.0 fL   MCH 27.0 26.0 - 34.0 pg   MCHC 32.1 30.0 - 36.0 g/dL   RDW NOT CALCULATED 11.5 - 15.5 %   Platelets 363 150 - 400 K/uL   Neutrophils Relative % 56 %   Neutro Abs 5.2 1.7 - 7.7 K/uL   Lymphocytes Relative 37 %   Lymphs Abs 3.4 0.7 - 4.0 K/uL   Monocytes Relative 5 %   Monocytes Absolute 0.4 0.1 - 1.0 K/uL   Eosinophils Relative 2 %   Eosinophils Absolute 0.1 0.0 - 0.7 K/uL   Basophils Relative 0 %   Basophils Absolute 0.0 0.0 - 0.1 K/uL  Comprehensive metabolic panel     Status: Abnormal   Collection Time: 12/06/17  7:51 PM  Result Value Ref Range   Sodium 136 135 - 145 mmol/L   Potassium 3.8 3.5 - 5.1 mmol/L   Chloride 105 98 - 111 mmol/L   CO2 22 22 - 32 mmol/L   Glucose, Bld 87 70 - 99 mg/dL   BUN 8 6 - 20 mg/dL   Creatinine, Ser 0.41 (L) 0.44 - 1.00 mg/dL   Calcium 8.8 (L) 8.9 - 10.3 mg/dL   Total Protein 7.1 6.5 - 8.1 g/dL   Albumin 3.2 (L) 3.5 - 5.0 g/dL   AST 19 15 - 41 U/L   ALT 17 0 - 44 U/L   Alkaline Phosphatase 42 38 - 126 U/L   Total Bilirubin 0.5 0.3 - 1.2 mg/dL   GFR calc non Af Amer >60 >60 mL/min   GFR calc Af Amer >60 >60 mL/min   Anion gap 9 5 - 15  Type and screen [place]     Status: None (Preliminary result)   Collection Time: 12/06/17  7:51 PM  Result Value Ref Range   ABO/RH(D) PENDING    Antibody Screen PENDING    Sample Expiration      12/09/2017 Performed at [facility], [address], [place], [place] [address]    Protein / creatinine ratio, urine     Status: None  Collection Time: 12/06/17  7:53 PM  Result Value Ref Range   Creatinine, Urine 182.00 mg/dL   Total Protein, Urine 14 mg/dL   Protein Creatinine Ratio 0.08 0.00 - 0.15 mg/mg[Cre]  Urinalysis, Routine w reflex microscopic     Status: Abnormal   Collection Time: 12/06/17  7:53 PM  Result Value Ref Range   Color, Urine YELLOW YELLOW   APPearance HAZY (A) CLEAR   Specific Gravity, Urine 1.027 1.005 - 1.030   pH 5.0 5.0 - 8.0   Glucose, UA NEGATIVE NEGATIVE mg/dL   Hgb urine dipstick SMALL (A) NEGATIVE   Bilirubin Urine NEGATIVE NEGATIVE   Ketones, ur NEGATIVE NEGATIVE mg/dL   Protein, ur NEGATIVE NEGATIVE mg/dL   Nitrite NEGATIVE NEGATIVE   Leukocytes, UA NEGATIVE NEGATIVE   RBC / HPF 0-5 0 - 5 RBC/hpf   WBC, UA 0-5 0 - 5 WBC/hpf   Bacteria, UA RARE (A) NONE SEEN   Squamous Epithelial / LPF 0-5 0 - 5   Mucus PRESENT     Patient Vitals for the past 24 hrs:  BP Temp Temp src Pulse Resp SpO2 Height Weight  12/06/17 2235 - - - - - - 5\' 1"  (1.549 m) 83.5 kg  12/06/17 2225 (!) 151/74 - - 94 - 100 % - -  12/06/17 2214 (!) 123/110 99 F (37.2 C) Oral 91 17 100 % - -  12/06/17 2131 125/70 - - 90 - - - -  12/06/17 2116 137/70 - - 88 - - - -  12/06/17 2101 (!) 147/78 - - 86 - - - -  12/06/17 2055 (!) 141/77 - - 85 - - - -  12/06/17 2053 (!) 141/77 - - - - - - -  12/06/17 2046 (!) 147/77 - - 86 - - - -  12/06/17 2042 (!) 146/79 - - 86 - - - -  12/06/17 2031 (!) 146/113 - - 81 - - - -  12/06/17 2016 (!) 166/101 - - 74 - - - -  12/06/17 2006 (!) 169/102 - - 69 - - - -  12/06/17 1957 (!) 173/104 - - 71 - - - -  12/06/17 1931 (!) 180/105 - - 68 - - - -  12/06/17 1924 (!) 183/113 - - 72 - - - -  12/06/17 1915 (!) 171/110 - - 69 - - - -  12/06/17 1900 (!) 150/93 98.2 F (36.8 C) Oral 74 18 - - 83.5 kg    MAU Course  Procedures None  MDM FHR - 150 bpm UA, CBC, CMP and Urine Protein/creatinine ratio wnl    Assessment and Plan    21 w 6d IUP well dated by sono Abnl quad screen with nl MFM sono Severe HTN- ? PEC , previable remote from term. NL labs. Start Procardia XL 30mg  Admit to Mag prophylaxis and HTN management Rpt labs in AM. Strict I/Os. Will order MFM consult

## 2017-12-06 NOTE — MAU Note (Signed)
Pt reports that she had elevated BP at home today. Has had a headache since Thursday. Pt started seeing spots today. Took tylenol around 12:30 with no relief.

## 2017-12-06 NOTE — MAU Note (Signed)
Reports she has been monitoring her BP today and that it was elevated, 160/90  Reports that [facility] ctx have been more painful and coming more frequently  Headache since Thursday, tried some tylenol and has helped a little bit   Seeing spots

## 2017-12-06 NOTE — MAU Provider Note (Addendum)
History     CSN: [id number]  Arrival date and time: 12/06/17 1845   First Provider Initiated Contact with Patient 12/06/17 1922      Chief Complaint  Patient presents with  . Headache  . Abdominal Pain  . Hypertension   HPI  Jean Garcia is a 42 y.o. [id number] at [redacted]w[redacted]d who presents to MAU today with complaint of elevated blood pressure and headache. The patient denies CHTN history. She states last pregnancy in 2012 she had eclampsia. She states BP has been slowly rising in the office of the last couple of visits, but had only been mildly elevated in the 140z/90s. She has had a headache x 2 days and noted floaters today. She took BP at home and it was 160s/90s. She denies RUQ abdominal pain or edema. She also denies bleeding, LOF or contractions.    OB History    Gravida  8   Para  5   Term  5   Preterm      AB  2   Living  5     SAB  2   TAB      Ectopic      Multiple      Live Births              Past Medical History:  Diagnosis Date  . Hypertension   . Protein S deficiency [facility])     Past Surgical History:  Procedure Laterality Date  . NO PAST SURGERIES      Family History  Problem Relation Age of Onset  . Alcohol abuse Neg Hx   . Anxiety disorder Neg Hx   . Bipolar disorder Neg Hx   . Depression Neg Hx   . Drug abuse Neg Hx     Social History   Tobacco Use  . Smoking status: Never Smoker  . Smokeless tobacco: Never Used  Substance Use Topics  . Alcohol use: No  . Drug use: No    Allergies:  Allergies  Allergen Reactions  . Ibuprofen     sensitivity    Medications Prior to Admission  Medication Sig Dispense Refill Last Dose  . enoxaparin (LOVENOX) 40 MG/0.4ML injection Inject 0.4 mLs (40 mg total) into the skin daily. 30 Syringe 9 12/06/2017 at Unknown time  . Prenatal Vit-Fe Fumarate-FA (PRENATAL VITAMIN PO) Take by mouth.   12/05/2017 at Unknown time  . clotrimazole-betamethasone (LOTRISONE) cream Apply 1 application  topically 2 (two) times daily as needed (eczema).    More than a month at Unknown time  . norethindrone-ethinyl estradiol (MICROGESTIN) 1-20 MG-MCG tablet Microgestin 1/20 (21) 1 mg-20 mcg tablet  TK 1 T PO DAILY FOR 21 DAYS THEN IMMEDIATELY START A NEW PACK       Review of Systems  Constitutional: Negative for fever.  Eyes: Positive for visual disturbance.  Cardiovascular: Negative for leg swelling.  Gastrointestinal: Negative for abdominal pain, constipation, diarrhea, nausea and vomiting.  Neurological: Positive for headaches.   Physical Exam   Blood pressure (!) 146/79, pulse 86, temperature 98.2 F (36.8 C), temperature source Oral, resp. rate 18, weight 83.5 kg, last menstrual period 07/06/2017.  Physical Exam  Nursing note and vitals reviewed. Constitutional: She is oriented to person, place, and time. She appears well-developed and well-nourished. No distress.  HENT:  Head: Normocephalic and atraumatic.  Cardiovascular: Normal rate.  Respiratory: Effort normal.  GI: Soft. She exhibits no distension and no mass. There is no tenderness. There is no rebound and  no guarding.  Musculoskeletal: She exhibits no edema.  Neurological: She is alert and oriented to person, place, and time. She has normal reflexes.  No clonus  Skin: Skin is warm and dry. No erythema.  Psychiatric: She has a normal mood and affect.    Results for orders placed or performed during the hospital encounter of 12/06/17 (from the past 24 hour(s))  CBC with Differential/Platelet     Status: Abnormal   Collection Time: 12/06/17  7:51 PM  Result Value Ref Range   WBC 9.2 4.0 - 10.5 K/uL   RBC 3.97 3.87 - 5.11 MIL/uL   Hemoglobin 10.7 (L) 12.0 - 15.0 g/dL   HCT 33.3 (L) 36.0 - 46.0 %   MCV 83.9 78.0 - 100.0 fL   MCH 27.0 26.0 - 34.0 pg   MCHC 32.1 30.0 - 36.0 g/dL   RDW NOT CALCULATED 11.5 - 15.5 %   Platelets 363 150 - 400 K/uL   Neutrophils Relative % 56 %   Neutro Abs 5.2 1.7 - 7.7 K/uL    Lymphocytes Relative 37 %   Lymphs Abs 3.4 0.7 - 4.0 K/uL   Monocytes Relative 5 %   Monocytes Absolute 0.4 0.1 - 1.0 K/uL   Eosinophils Relative 2 %   Eosinophils Absolute 0.1 0.0 - 0.7 K/uL   Basophils Relative 0 %   Basophils Absolute 0.0 0.0 - 0.1 K/uL  Comprehensive metabolic panel     Status: Abnormal   Collection Time: 12/06/17  7:51 PM  Result Value Ref Range   Sodium 136 135 - 145 mmol/L   Potassium 3.8 3.5 - 5.1 mmol/L   Chloride 105 98 - 111 mmol/L   CO2 22 22 - 32 mmol/L   Glucose, Bld 87 70 - 99 mg/dL   BUN 8 6 - 20 mg/dL   Creatinine, Ser 0.41 (L) 0.44 - 1.00 mg/dL   Calcium 8.8 (L) 8.9 - 10.3 mg/dL   Total Protein 7.1 6.5 - 8.1 g/dL   Albumin 3.2 (L) 3.5 - 5.0 g/dL   AST 19 15 - 41 U/L   ALT 17 0 - 44 U/L   Alkaline Phosphatase 42 38 - 126 U/L   Total Bilirubin 0.5 0.3 - 1.2 mg/dL   GFR calc non Af Amer >60 >60 mL/min   GFR calc Af Amer >60 >60 mL/min   Anion gap 9 5 - 15  Protein / creatinine ratio, urine     Status: None   Collection Time: 12/06/17  7:53 PM  Result Value Ref Range   Creatinine, Urine 182.00 mg/dL   Total Protein, Urine 14 mg/dL   Protein Creatinine Ratio 0.08 0.00 - 0.15 mg/mg[Cre]  Urinalysis, Routine w reflex microscopic     Status: Abnormal   Collection Time: 12/06/17  7:53 PM  Result Value Ref Range   Color, Urine YELLOW YELLOW   APPearance HAZY (A) CLEAR   Specific Gravity, Urine 1.027 1.005 - 1.030   pH 5.0 5.0 - 8.0   Glucose, UA NEGATIVE NEGATIVE mg/dL   Hgb urine dipstick SMALL (A) NEGATIVE   Bilirubin Urine NEGATIVE NEGATIVE   Ketones, ur NEGATIVE NEGATIVE mg/dL   Protein, ur NEGATIVE NEGATIVE mg/dL   Nitrite NEGATIVE NEGATIVE   Leukocytes, UA NEGATIVE NEGATIVE   RBC / HPF 0-5 0 - 5 RBC/hpf   WBC, UA 0-5 0 - 5 WBC/hpf   Bacteria, UA RARE (A) NONE SEEN   Squamous Epithelial / LPF 0-5 0 - 5   Mucus PRESENT  Patient Vitals for the past 24 hrs:  BP Temp Temp src Pulse Resp Weight  12/06/17 2042 (!) 146/79 - - 86 - -   12/06/17 2031 (!) 146/113 - - 81 - -  12/06/17 2016 (!) 166/101 - - 74 - -  12/06/17 2006 (!) 169/102 - - 69 - -  12/06/17 1957 (!) 173/104 - - 71 - -  12/06/17 1931 (!) 180/105 - - 68 - -  12/06/17 1924 (!) 183/113 - - 72 - -  12/06/17 1915 (!) 171/110 - - 69 - -  12/06/17 1900 (!) 150/93 98.2 F (36.8 C) Oral 74 18 83.5 kg    MAU Course  Procedures None  MDM FHR - 150 bpm UA, CBC, CMP and Urine Protein/creatinine ratio  HTN protocol initiated after second severe range BP Discussed patient with Dr. Ronita Hipps. Recommends Procarida XL 30 mg PO in additional to HTN protocol and call back with lab results.  Labs are without gross abnormality. Discussed with Dr. Ronita Hipps, recommends 10 mg Procardia PO and monitor BP x ~ 30 minutes. Call back to finalize plan.  2045 - Care turned over to [facility], NP  Kerry Hough, PA-C 12/06/2017, 8:44 PM   Discussed patients BP readings with Dr. Ronita Hipps, 30 mins after po procardia given> per Dr. Ronita Hipps, will admit patient for Observation.   Assessment and Plan   A:  1. Severe preeclampsia, second trimester     P:  Admit to 3rd floor Magnesium Repeat labs in the AM No betamethasone per Dr. Ronita Hipps Labetalol protocol  Please call Dr. Ronita Hipps for further orders if needed  Garcia, Artist Pais, NP 12/06/2017 10:46 PM

## 2017-12-07 ENCOUNTER — Encounter: Payer: Self-pay | Admitting: Obstetrics and Gynecology

## 2017-12-07 DIAGNOSIS — O1410 Severe pre-eclampsia, unspecified trimester: Secondary | ICD-10-CM | POA: Diagnosis present

## 2017-12-07 LAB — CBC
HCT: 30.1 % — ABNORMAL LOW (ref 36.0–46.0)
Hemoglobin: 9.9 g/dL — ABNORMAL LOW (ref 12.0–15.0)
MCH: 27.3 pg (ref 26.0–34.0)
MCHC: 32.9 g/dL (ref 30.0–36.0)
MCV: 82.9 fL (ref 78.0–100.0)
Platelets: 327 10*3/uL (ref 150–400)
RBC: 3.63 MIL/uL — ABNORMAL LOW (ref 3.87–5.11)
WBC: 10.1 10*3/uL (ref 4.0–10.5)

## 2017-12-07 LAB — COMPREHENSIVE METABOLIC PANEL
ALT: 16 U/L (ref 0–44)
AST: 21 U/L (ref 15–41)
Albumin: 3 g/dL — ABNORMAL LOW (ref 3.5–5.0)
Alkaline Phosphatase: 35 U/L — ABNORMAL LOW (ref 38–126)
Anion gap: 10 (ref 5–15)
BUN: 6 mg/dL (ref 6–20)
CO2: 20 mmol/L — ABNORMAL LOW (ref 22–32)
Calcium: 7.5 mg/dL — ABNORMAL LOW (ref 8.9–10.3)
Chloride: 106 mmol/L (ref 98–111)
Creatinine, Ser: 0.5 mg/dL (ref 0.44–1.00)
GFR calc Af Amer: >60 mL/min (ref >60)
GFR calc non Af Amer: >60 mL/min (ref >60)
Glucose, Bld: 99 mg/dL (ref 70–99)
Potassium: 3.1 mmol/L — ABNORMAL LOW (ref 3.5–5.1)
Sodium: 136 mmol/L (ref 135–145)
Total Bilirubin: 0.3 mg/dL (ref 0.3–1.2)
Total Protein: 6.4 g/dL — ABNORMAL LOW (ref 6.5–8.1)

## 2017-12-07 LAB — PROTEIN / CREATININE RATIO, URINE
Creatinine, Urine: 110 mg/dL
Protein Creatinine Ratio: 0.05 mg/mg{Cre} (ref 0.00–0.15)
Total Protein, Urine: 6 mg/dL

## 2017-12-07 LAB — TSH: TSH: 0.64 u[IU]/mL (ref 0.350–4.500)

## 2017-12-07 LAB — ABO/RH: ABO/RH(D): A POS

## 2017-12-07 MED ORDER — POTASSIUM CHLORIDE CRYS ER 20 MEQ PO TBCR
40.0000 meq | EXTENDED_RELEASE_TABLET | Freq: Two times a day (BID) | ORAL | Status: DC
  Administered 2017-12-07 – 2017-12-09 (×4): 40 meq via ORAL
  Filled 2017-12-07 (×4): qty 2

## 2017-12-07 NOTE — Progress Notes (Signed)
24 hour urine collection initiated. This urine discarded.

## 2017-12-07 NOTE — Consult Note (Signed)
This is a maternal-fetal medicine consultation note on patient Jean Garcia per the request of Dr. Charlotte Crumb.  Jean Garcia is a 42 year old [name]-0-2-5 currently at 22 weeks and 0 days who presented on 12/06/2017 secondary to headaches and elevated blood pressures.    Her past medical history is notable for history of eclampsia in 2012 where she underwent delivery at [redacted] weeks gestation.  She denies any Chronic Hypertension.  The patient has a history also of protein S deficiency and is currently on Lovenox 40 daily.  She has noticed over the past couple of weeks that her blood pressures have been rising.  Initially it was noted to be elevated at Dr.  Marin Olp office visit 140s/80's, but also elevated at home for the past 3 days.  She had taken a couple of days off work on Thursday and Friday secondary to some headache and some foot swelling.  On Saturday she noticed some headaches with some scotomata after urination and Tylenol was not helping with her headaches.  She noticed that her blood pressure was 160/90 and came in for further evaluation.  Upon admission to [facility] he was noted to have blood pressure was 180s over 110s.  She was initially treated with hydralazine x1 and labetalol x3 without significant improvement she is currently on nifedipine 30 XL.  At this time she is stating much improved headaches compared to yesterday and it is only dull at this time.  The most recent blood pressures are noted to be 116/74, the laboratory results show normal platelet count of 363,000 the ALT and AST are 17 and 19 respectively, there is a normal chemistry panel and the fUrine protein to creatinine ratio is 0.08.  Past medical history is notable for gestational diabetes in 2010, a gastric sleeve procedure in 2015, history of iron deficiency anemia status post iron therapy in this pregnancy, history of protein S deficiency currently taking Lovenox, history of migraine headaches having  taken Topamax previously, history of depression and anxiety.  Past maternal-fetal medicine evaluation for elevated risk for Down syndrome from an abnormal quad screen includes an ultrasound on August 26 by Dr. Yates Decamp.  The EDD was confirmed to be 04/12/2018.  Given the normal ultrasound evaluation, no additional testing was done.  The recommendation from that consult includes ultrasounds for growth every 4 weeks, weekly biophysical profiles beginning at 32 weeks, and delivery at 39 weeks.  Assessment:   1) 22w 0d IUP 2) Elevated blood pressures in severe range likely severe gestational hypertension 3) History of Eclampsia  4) History of Migraines 5) Protein S deficiency (on lovenox) 6) AMA  7) History of anemia, depression and anxiety  Plan:   1) Continue Procardia 30 mg XL for blood pressure control. 2) Discontinue magnesium after 24 hr urine.   3) 24-hour urine is currently pending.  At this time there is no overt evidence of severe preeclampsia, however, it could develop with advancing gestation given the patient's history and current presentation.  Therefore, close monitoring for the duration of the pregnancy is warranted to include: A) Home Blood pressure monitoring 3 times per day or with symptomatology.  We discussed the symptomatology associated with severe preeclampsia that includes headaches, visual changes, right upper quadrant pain, altered mental status, dyspnea or chest pain and generalized lethargy, in her case, would be reasons to notify an obstetrical provider for further evaluation. B) Follow-up visits every 2 weeks with OB provider. C) Follow-up with MFM for fetal evaluation monthly D)  Fetal Testing with NST/BPP beginning at 32 weeks (or earlier if growth restriction is noted).   We discussed that if severe preeclampsia develops and blood pressures are not controllable or fetal well-being is not achieved, the only treatment is delivery.  They stated understanding.  All of  her and her husband's questions were answered.  Total time for the consultation was 40 minutes of which greater than 50% of the time was direct face-to-face.

## 2017-12-07 NOTE — Progress Notes (Signed)
Patient ID: Jean Garcia, female   DOB: 12-Oct-1975, 42 y.o.   MRN: [id number] HD#1 [redacted]w[redacted]d Severe HTN   S: No complaints No HA, visual changes or epigastric pain Good FM No bleeding or LOF BP 109/76 (BP Location: Left Arm)   Pulse 89   Temp 98.8 F (37.1 C) (Oral)   Resp 17   Ht 5\' 1"  (1.549 m)   Wt 83.5 kg   LMP 07/06/2017   SpO2 100%   BMI 34.78 kg/m    HEENT:nl NCAT Neck : supple Lungs: CTA CV:RRR ABD: gravid , NT EXT: NO c/c/e Neuro: non focal Skin: intact  FHTs 155 CBC    Component Value Date/Time   WBC 10.1 12/07/2017 0535   RBC 3.63 (L) 12/07/2017 0535   HGB 9.9 (L) 12/07/2017 0535   HGB 10.1 (L) 11/28/2017 1332   HCT 30.1 (L) 12/07/2017 0535   PLT 327 12/07/2017 0535   PLT 340 11/28/2017 1332   MCV 82.9 12/07/2017 0535   MCV 78.2 (A) 05/24/2017 1427   MCH 27.3 12/07/2017 0535   MCHC 32.9 12/07/2017 0535   RDW NOT CALCULATED 12/07/2017 0535   LYMPHSABS 3.4 12/06/2017 1951   MONOABS 0.4 12/06/2017 1951   EOSABS 0.1 12/06/2017 1951   BASOSABS 0.0 12/06/2017 1951    CMP     Component Value Date/Time   NA 136 12/07/2017 0535   NA 145 (H) 05/19/2017 1536   K 3.1 (L) 12/07/2017 0535   CL 106 12/07/2017 0535   CO2 20 (L) 12/07/2017 0535   GLUCOSE 99 12/07/2017 0535   BUN 6 12/07/2017 0535   BUN 8 05/19/2017 1536   CREATININE 0.50 12/07/2017 0535   CREATININE 0.58 11/28/2017 1332   CALCIUM 7.5 (L) 12/07/2017 0535   PROT 6.4 (L) 12/07/2017 0535   PROT 7.6 05/19/2017 1536   ALBUMIN 3.0 (L) 12/07/2017 0535   ALBUMIN 4.1 05/19/2017 1536   AST 21 12/07/2017 0535   AST 16 11/28/2017 1332   ALT 16 12/07/2017 0535   ALT 12 11/28/2017 1332   ALKPHOS 35 (L) 12/07/2017 0535   BILITOT 0.3 12/07/2017 0535   BILITOT 0.2 (L) 11/28/2017 1332   GFRNONAA >60 12/07/2017 0535   GFRNONAA >60 11/28/2017 1332   GFRAA >60 12/07/2017 0535   GFRAA >60 11/28/2017 1332   UO adequate  IMP 22w 0d IUP Severe HTN- ? PEC with severe features , previable , remote  from term. Labs stable and nl. BP improved.  History of PEC AMA Protein S def on Lovenox History of anemia s/p IV Iron  P: Continue Mag prophylaxis and Procardia 24hr urine for UTP and CrCl (Nl UPC ratio) Acute HTN management as needed MFM consult

## 2017-12-08 ENCOUNTER — Inpatient Hospital Stay (HOSPITAL_COMMUNITY): Payer: BLUE CROSS/BLUE SHIELD

## 2017-12-08 LAB — CREATININE CLEARANCE, URINE, 24 HOUR
Collection Interval-CRCL: 24 hours
Creatinine Clearance: 188 mL/min — ABNORMAL HIGH (ref 75–115)
Creatinine, 24H Ur: 1351 mg/d (ref 600–1800)
Creatinine, Urine: 23.96 mg/dL
Urine Total Volume-CRCL: 5640 mL

## 2017-12-08 LAB — CORTISOL: Cortisol, Plasma: 11.1 ug/dL

## 2017-12-08 MED ORDER — ENOXAPARIN SODIUM 40 MG/0.4ML ~~LOC~~ SOLN
40.0000 mg | SUBCUTANEOUS | Status: DC
  Administered 2017-12-08: 40 mg via SUBCUTANEOUS
  Filled 2017-12-08 (×2): qty 0.4

## 2017-12-08 NOTE — Progress Notes (Signed)
HD # 3 22 1/7 weeks HTN  S: feels fine . Had sl h/a this am that resolved with tylenol Denies visual changes or epigastric pain (+) FM O:BP 135/81 (BP Location: Left Arm)   Pulse 78   Temp 98.1 F (36.7 C) (Oral)   Resp 16   Ht 5\' 1"  (1.549 m)   Wt 83.5 kg   LMP 07/06/2017   SpO2 100%   BMI 34.78 kg/m  Patient Vitals for the past 24 hrs:  BP Temp Temp src Pulse Resp SpO2  12/08/17 1142 135/81 98.1 F (36.7 C) Oral 78 16 100 %  12/08/17 1000 - - - - 16 -  12/08/17 0900 - - - - 16 -  12/08/17 0758 (!) 151/94 98.2 F (36.8 C) Oral 84 18 100 %  12/08/17 0514 - - - - 18 -  12/08/17 0409 139/90 98.3 F (36.8 C) Oral 83 18 100 %  12/08/17 0330 - - - - 16 -  12/08/17 0223 - - - - 18 -  12/08/17 0124 - - - - 16 -  12/08/17 0009 140/87 98.6 F (37 C) Oral 85 18 95 %  12/07/17 2300 - - - - 18 -  12/07/17 2143 - - - - 16 -  12/07/17 2124 - - - - 18 -  12/07/17 2014 - - - - 18 -  12/07/17 1947 139/87 98.5 F (36.9 C) Oral 84 18 100 %  12/07/17 1600 - - - - 15 -  12/07/17 1536 118/76 98.4 F (36.9 C) Oral 90 18 99 %  12/07/17 1500 - - - - 16 -  12/07/17 1400 - - - - 16 -  12/07/17 1300 - - - - 15 -  lungs clear to A Cor RRR Abd gravid soft non tender Pad none Ext no edema./ DTR 1+  Tracing (+) FHR 135  Labs: cortisol nl 24 hr UTP/crCL sent off today TSH nl  aldosterone labs pending  CBC Latest Ref Rng & Units 12/07/2017 12/06/2017 11/28/2017  WBC 4.0 - 10.5 K/uL 10.1 9.2 8.4  Hemoglobin 12.0 - 15.0 g/dL 9.9(L) 10.7(L) 10.1(L)  Hematocrit 36.0 - 46.0 % 30.1(L) 33.3(L) 31.8(L)  Platelets 150 - 400 K/uL 327 363 340   CMP Latest Ref Rng & Units 12/07/2017 12/06/2017 11/28/2017  Glucose 70 - 99 mg/dL 99 87 84  BUN 6 - 20 mg/dL 6 8 5(L)  Creatinine 0.44 - 1.00 mg/dL 0.50 0.41(L) 0.58  Sodium 135 - 145 mmol/L 136 136 138  Potassium 3.5 - 5.1 mmol/L 3.1(L) 3.8 3.2(L)  Chloride 98 - 111 mmol/L 106 105 104  CO2 22 - 32 mmol/L 20(L) 22 24  Calcium 8.9 - 10.3 mg/dL 7.5(L)  8.8(L) 9.1  Total Protein 6.5 - 8.1 g/dL 6.4(L) 7.1 7.2  Total Bilirubin 0.3 - 1.2 mg/dL 0.3 0.5 0.2(L)  Alkaline Phos 38 - 126 U/L 35(L) 42 46  AST 15 - 41 U/L 21 19 16   ALT 0 - 44 U/L 16 17 12   IMP: chronic HTN  No evidence of superimposed preeclampsia on procardia XL: Hypokalemia w/o cause noted Protein S deficiency. Per pt has not been on med( lovenox) since Saturday Hx gastric sleeve IUP @ 22 1/7 weeks AMA>40 ABnl quad screen with normal NIPT   P) MFM note appreciated Observe off magnesium today. Await pending labs( r/o secondary causes of HTN). Cont BP med. Recheck potassium in am. Cont inpt. Home tomorrow if remains stable.

## 2017-12-09 DIAGNOSIS — O10912 Unspecified pre-existing hypertension complicating pregnancy, second trimester: Secondary | ICD-10-CM | POA: Diagnosis present

## 2017-12-09 LAB — UIFE/LIGHT CHAINS/TP QN, 24-HR UR
FR KAPPA LT CH,24HR: 56 mg/24 hr
FR LAMBDA LT CH,24HR: 3 mg/24 hr
Free Kappa Lt Chains,Ur: 9.99 mg/L (ref 1.35–24.19)
Free Kappa/Lambda Ratio: 16.38 — ABNORMAL HIGH (ref 2.04–10.37)
Free Lambda Lt Chains,Ur: 0.61 mg/L (ref 0.24–6.66)
Total Protein, Urine-Ur/day: <226 mg/24 hr — ABNORMAL HIGH (ref 30–150)
Total Protein, Urine: <4 mg/dL
Total Volume: 5640

## 2017-12-09 LAB — BASIC METABOLIC PANEL
Anion gap: 10 (ref 5–15)
BUN: <5 mg/dL — ABNORMAL LOW (ref 6–20)
CO2: 20 mmol/L — ABNORMAL LOW (ref 22–32)
Calcium: 8.4 mg/dL — ABNORMAL LOW (ref 8.9–10.3)
Chloride: 108 mmol/L (ref 98–111)
Creatinine, Ser: 0.35 mg/dL — ABNORMAL LOW (ref 0.44–1.00)
GFR calc Af Amer: >60 mL/min (ref >60)
GFR calc non Af Amer: >60 mL/min (ref >60)
Glucose, Bld: 77 mg/dL (ref 70–99)
Potassium: 3.9 mmol/L (ref 3.5–5.1)
Sodium: 138 mmol/L (ref 135–145)

## 2017-12-09 LAB — ANA W/REFLEX IF POSITIVE: Anti Nuclear Antibody(ANA): NEGATIVE

## 2017-12-09 MED ORDER — NIFEDIPINE ER 30 MG PO TB24: 30.0000 mg | ORAL_TABLET | Freq: Every day | ORAL | 11 refills | Status: DC

## 2017-12-09 NOTE — Discharge Summary (Signed)
Physician Discharge Summary  Patient ID: Jean Garcia MRN: [id number] DOB/AGE: 25-Jan-1976 42 y.o.  Admit date: 12/06/2017 Discharge date: 12/09/2017  Admission Diagnoses: severe HTN ? Preeclampsia with severe features, protein S deficiency, hx gastric sleeve, IUP @ 22 weeks, AMA>35 Discharge Diagnoses: chronic HTN in 2nd trimester pregnancy,protein S deficiency, hx gastric sleeve, IUP@ 22 2/7 weeks, AMA>35   Discharged Condition: stable  Hospital Course: pt was admitted to [facility] with severe HTN. She was given antihypertensive labetalol protocol and started on magnesium. [place] labs done. MFM consult obtained. 24 hr UTP/crCl collection was started. Additional labs to r/o secondary cause of HTN done. Pt was continued on magnesium until collection was done. Pt was found to be hypokalemic which was repleted with potassium supplement. FHR q shift  Consults: MFM  Significant Diagnostic Studies: labs: CBC, CMP, BMP, 24 hr UTP/crcl, aldosterone  Treatments: magnesium, IV labetalol, procardia  Discharge Exam: Blood pressure (!) 149/99, pulse 62, temperature 98.5 F (36.9 C), temperature source Oral, resp. rate 19, height 5\' 1"  (1.549 m), weight 83.5 kg, last menstrual period 07/06/2017, SpO2 96 %. General appearance: alert, cooperative and no distress Resp: clear to auscultation bilaterally Breasts: Normal to palpation without dominant masses Cardio: regular rate and rhythm, S1, S2 normal, no murmur, click, rub or gallop GI: gravid non tender Pelvic: deferred Extremities: no edema, redness or tenderness in the calves or thighs  Disposition: Discharge disposition: 01-Home or Self Care       Discharge Instructions    Bed rest   Complete by:  As directed    Diet - low sodium heart healthy   Complete by:  As directed      Allergies as of 12/09/2017      Reactions   Ibuprofen Other (See Comments)   sensitivity      Medication List    STOP taking these medications   MICROGESTIN  1-20 MG-MCG tablet Generic drug:  norethindrone-ethinyl estradiol     TAKE these medications   clotrimazole-betamethasone cream Commonly known as:  LOTRISONE Apply 1 application topically 2 (two) times daily as needed (eczema).   enoxaparin 40 MG/0.4ML injection Commonly known as:  LOVENOX Inject 0.4 mLs (40 mg total) into the skin daily.   NIFEdipine 30 MG 24 hr tablet Commonly known as:  PROCARDIA-XL/ADALAT CC Take 1 tablet (30 mg total) by mouth daily.   PRENATAL VITAMIN PO Take by mouth.      Follow-up Information    Servando Salina, MD Follow up on 12/12/2017.   Specialty:  Obstetrics and Gynecology Contact information: [address] [place] [address] [phone]           Signed: Alanda Slim A Shantil Vallejo 12/09/2017, 8:59 AM

## 2017-12-09 NOTE — Progress Notes (Signed)
S: no complaint Denies h/a, visual changes or epigastric pain O:  Patient Vitals for the past 24 hrs:  BP Temp Temp src Pulse Resp SpO2  12/09/17 0451 (!) 149/99 98.5 F (36.9 C) Oral 62 19 96 %  12/08/17 2338 (!) 151/100 99 F (37.2 C) Oral 73 18 100 %  12/08/17 1953 131/84 99 F (37.2 C) Oral 75 19 97 %  12/08/17 1552 130/80 99.7 F (37.6 C) Oral 84 16 97 %  12/08/17 1142 135/81 98.1 F (36.7 C) Oral 78 16 100 %  12/08/17 1000 - - - - 16 -  12/08/17 0900 - - - - 16 -  lungs clear tp A Cor RRR  Abdomen: gravid, soft Pelvic deferred Extr no edema  . BMET    Component Value Date/Time   NA 138 12/09/2017 0544   NA 145 (H) 05/19/2017 1536   K 3.9 12/09/2017 0544   CL 108 12/09/2017 0544   CO2 20 (L) 12/09/2017 0544   GLUCOSE 77 12/09/2017 0544   BUN <5 (L) 12/09/2017 0544   BUN 8 05/19/2017 1536   CREATININE 0.35 (L) 12/09/2017 0544   CREATININE 0.58 11/28/2017 1332   CALCIUM 8.4 (L) 12/09/2017 0544   GFRNONAA >60 12/09/2017 0544   GFRNONAA >60 11/28/2017 1332   GFRAA >60 12/09/2017 0544   GFRAA >60 11/28/2017 1332  IMP: chronic HTN controlled with procardia IUP @ 22 2/7 weeks Protein S deficiency Hypokalemia resolved Hx gastric sleeve P) d/c home. F/u office Fri. Await pending labs. Cont procardia. OOW

## 2017-12-12 LAB — ALDOSTERONE + RENIN ACTIVITY W/ RATIO
ALDO / PRA Ratio: 11 (ref 0.0–30.0)
Aldosterone: 10.8 ng/dL (ref 0.0–30.0)
PRA LC/MS/MS: 0.983 ng/mL/hr (ref 0.167–5.380)

## 2017-12-16 ENCOUNTER — Ambulatory Visit (HOSPITAL_COMMUNITY): Payer: BLUE CROSS/BLUE SHIELD

## 2017-12-17 ENCOUNTER — Ambulatory Visit (HOSPITAL_COMMUNITY)
Admission: RE | Admit: 2017-12-17 | Discharge: 2017-12-17 | Disposition: A | Discharge status: Home / Self Care | Payer: BLUE CROSS/BLUE SHIELD | Source: Ambulatory Visit | Attending: Obstetrics and Gynecology | Admitting: Obstetrics and Gynecology

## 2017-12-17 ENCOUNTER — Encounter (HOSPITAL_COMMUNITY): Payer: Self-pay

## 2017-12-17 DIAGNOSIS — Z362 Encounter for other antenatal screening follow-up: Secondary | ICD-10-CM | POA: Insufficient documentation

## 2017-12-17 DIAGNOSIS — O289 Unspecified abnormal findings on antenatal screening of mother: Secondary | ICD-10-CM | POA: Diagnosis not present

## 2017-12-17 DIAGNOSIS — O88212 Thromboembolism in pregnancy, second trimester: Secondary | ICD-10-CM | POA: Diagnosis not present

## 2017-12-17 DIAGNOSIS — O99842 Bariatric surgery status complicating pregnancy, second trimester: Secondary | ICD-10-CM | POA: Diagnosis not present

## 2017-12-17 DIAGNOSIS — O09292 Supervision of pregnancy with other poor reproductive or obstetric history, second trimester: Secondary | ICD-10-CM | POA: Insufficient documentation

## 2017-12-17 DIAGNOSIS — O09522 Supervision of elderly multigravida, second trimester: Secondary | ICD-10-CM | POA: Diagnosis not present

## 2017-12-17 DIAGNOSIS — Z3A23 23 weeks gestation of pregnancy: Secondary | ICD-10-CM

## 2017-12-17 DIAGNOSIS — O09529 Supervision of elderly multigravida, unspecified trimester: Secondary | ICD-10-CM

## 2017-12-17 DIAGNOSIS — O99212 Obesity complicating pregnancy, second trimester: Secondary | ICD-10-CM

## 2017-12-17 DIAGNOSIS — O09293 Supervision of pregnancy with other poor reproductive or obstetric history, third trimester: Secondary | ICD-10-CM

## 2017-12-17 DIAGNOSIS — O10912 Unspecified pre-existing hypertension complicating pregnancy, second trimester: Secondary | ICD-10-CM

## 2017-12-18 ENCOUNTER — Other Ambulatory Visit (HOSPITAL_COMMUNITY): Payer: Self-pay | Admitting: *Deleted

## 2017-12-18 DIAGNOSIS — O09522 Supervision of elderly multigravida, second trimester: Secondary | ICD-10-CM

## 2018-01-12 ENCOUNTER — Other Ambulatory Visit: Payer: Self-pay

## 2018-01-12 ENCOUNTER — Encounter (HOSPITAL_COMMUNITY): Payer: Self-pay | Admitting: Anesthesiology

## 2018-01-12 ENCOUNTER — Inpatient Hospital Stay (HOSPITAL_COMMUNITY)
Admission: AD | Admit: 2018-01-12 | Discharge: 2018-01-21 | DRG: 784 | Disposition: A | Discharge status: Home / Self Care | Payer: BLUE CROSS/BLUE SHIELD | Attending: Obstetrics and Gynecology | Admitting: Obstetrics and Gynecology

## 2018-01-12 ENCOUNTER — Encounter (HOSPITAL_COMMUNITY): Payer: Self-pay

## 2018-01-12 DIAGNOSIS — O09522 Supervision of elderly multigravida, second trimester: Secondary | ICD-10-CM | POA: Diagnosis not present

## 2018-01-12 DIAGNOSIS — O365930 Maternal care for other known or suspected poor fetal growth, third trimester, not applicable or unspecified: Secondary | ICD-10-CM | POA: Diagnosis not present

## 2018-01-12 DIAGNOSIS — E876 Hypokalemia: Secondary | ICD-10-CM | POA: Diagnosis present

## 2018-01-12 DIAGNOSIS — O368390 Maternal care for abnormalities of the fetal heart rate or rhythm, unspecified trimester, not applicable or unspecified: Secondary | ICD-10-CM

## 2018-01-12 DIAGNOSIS — O365990 Maternal care for other known or suspected poor fetal growth, unspecified trimester, not applicable or unspecified: Secondary | ICD-10-CM

## 2018-01-12 DIAGNOSIS — Z362 Encounter for other antenatal screening follow-up: Secondary | ICD-10-CM | POA: Diagnosis not present

## 2018-01-12 DIAGNOSIS — O99284 Endocrine, nutritional and metabolic diseases complicating childbirth: Secondary | ICD-10-CM | POA: Diagnosis present

## 2018-01-12 DIAGNOSIS — O289 Unspecified abnormal findings on antenatal screening of mother: Secondary | ICD-10-CM | POA: Diagnosis not present

## 2018-01-12 DIAGNOSIS — O1002 Pre-existing essential hypertension complicating childbirth: Principal | ICD-10-CM | POA: Diagnosis present

## 2018-01-12 DIAGNOSIS — Z09 Encounter for follow-up examination after completed treatment for conditions other than malignant neoplasm: Secondary | ICD-10-CM

## 2018-01-12 DIAGNOSIS — O114 Pre-existing hypertension with pre-eclampsia, complicating childbirth: Secondary | ICD-10-CM | POA: Diagnosis present

## 2018-01-12 DIAGNOSIS — Z302 Encounter for sterilization: Secondary | ICD-10-CM

## 2018-01-12 DIAGNOSIS — Z3490 Encounter for supervision of normal pregnancy, unspecified, unspecified trimester: Secondary | ICD-10-CM

## 2018-01-12 DIAGNOSIS — O10012 Pre-existing essential hypertension complicating pregnancy, second trimester: Secondary | ICD-10-CM | POA: Diagnosis not present

## 2018-01-12 DIAGNOSIS — O9902 Anemia complicating childbirth: Secondary | ICD-10-CM | POA: Diagnosis present

## 2018-01-12 DIAGNOSIS — Z3A27 27 weeks gestation of pregnancy: Secondary | ICD-10-CM | POA: Diagnosis not present

## 2018-01-12 DIAGNOSIS — O10912 Unspecified pre-existing hypertension complicating pregnancy, second trimester: Secondary | ICD-10-CM | POA: Diagnosis not present

## 2018-01-12 DIAGNOSIS — O119 Pre-existing hypertension with pre-eclampsia, unspecified trimester: Secondary | ICD-10-CM

## 2018-01-12 DIAGNOSIS — O99212 Obesity complicating pregnancy, second trimester: Secondary | ICD-10-CM | POA: Diagnosis not present

## 2018-01-12 DIAGNOSIS — O99844 Bariatric surgery status complicating childbirth: Secondary | ICD-10-CM | POA: Diagnosis present

## 2018-01-12 DIAGNOSIS — O10919 Unspecified pre-existing hypertension complicating pregnancy, unspecified trimester: Secondary | ICD-10-CM

## 2018-01-12 DIAGNOSIS — O9912 Other diseases of the blood and blood-forming organs and certain disorders involving the immune mechanism complicating childbirth: Secondary | ICD-10-CM | POA: Diagnosis present

## 2018-01-12 DIAGNOSIS — D500 Iron deficiency anemia secondary to blood loss (chronic): Secondary | ICD-10-CM | POA: Diagnosis present

## 2018-01-12 DIAGNOSIS — D6859 Other primary thrombophilia: Secondary | ICD-10-CM | POA: Diagnosis present

## 2018-01-12 DIAGNOSIS — O326XX0 Maternal care for compound presentation, not applicable or unspecified: Secondary | ICD-10-CM | POA: Diagnosis present

## 2018-01-12 DIAGNOSIS — O99842 Bariatric surgery status complicating pregnancy, second trimester: Secondary | ICD-10-CM | POA: Diagnosis not present

## 2018-01-12 DIAGNOSIS — O09292 Supervision of pregnancy with other poor reproductive or obstetric history, second trimester: Secondary | ICD-10-CM | POA: Diagnosis not present

## 2018-01-12 DIAGNOSIS — O1490 Unspecified pre-eclampsia, unspecified trimester: Secondary | ICD-10-CM

## 2018-01-12 LAB — COMPREHENSIVE METABOLIC PANEL
ALT: 41 U/L (ref 0–44)
AST: 29 U/L (ref 15–41)
Albumin: 3 g/dL — ABNORMAL LOW (ref 3.5–5.0)
Alkaline Phosphatase: 62 U/L (ref 38–126)
Anion gap: 10 (ref 5–15)
BUN: 9 mg/dL (ref 6–20)
CO2: 21 mmol/L — ABNORMAL LOW (ref 22–32)
Calcium: 8.4 mg/dL — ABNORMAL LOW (ref 8.9–10.3)
Chloride: 104 mmol/L (ref 98–111)
Creatinine, Ser: 0.52 mg/dL (ref 0.44–1.00)
GFR calc Af Amer: >60 mL/min (ref >60)
GFR calc non Af Amer: >60 mL/min (ref >60)
Glucose, Bld: 83 mg/dL (ref 70–99)
Potassium: 3.1 mmol/L — ABNORMAL LOW (ref 3.5–5.1)
Sodium: 135 mmol/L (ref 135–145)
Total Bilirubin: 0.4 mg/dL (ref 0.3–1.2)
Total Protein: 6.9 g/dL (ref 6.5–8.1)

## 2018-01-12 LAB — TYPE AND SCREEN
ABO/RH(D): A POS
Antibody Screen: NEGATIVE

## 2018-01-12 LAB — CBC
HCT: 31.8 % — ABNORMAL LOW (ref 36.0–46.0)
Hemoglobin: 10.7 g/dL — ABNORMAL LOW (ref 12.0–15.0)
MCH: 28.6 pg (ref 26.0–34.0)
MCHC: 33.6 g/dL (ref 30.0–36.0)
MCV: 85 fL (ref 80.0–100.0)
Platelets: 272 10*3/uL (ref 150–400)
RBC: 3.74 MIL/uL — ABNORMAL LOW (ref 3.87–5.11)
RDW: 21.2 % — ABNORMAL HIGH (ref 11.5–15.5)
WBC: 8.4 10*3/uL (ref 4.0–10.5)
nRBC: 0 % (ref 0.0–0.2)

## 2018-01-12 LAB — PROTEIN / CREATININE RATIO, URINE
Creatinine, Urine: 253 mg/dL
Protein Creatinine Ratio: 0.09 mg/mg{Cre} (ref 0.00–0.15)
Total Protein, Urine: 22 mg/dL

## 2018-01-12 LAB — URIC ACID: Uric Acid, Serum: 5.9 mg/dL (ref 2.5–7.1)

## 2018-01-12 MED ORDER — LABETALOL HCL 200 MG PO TABS
200.0000 mg | ORAL_TABLET | Freq: Two times a day (BID) | ORAL | Status: DC
  Administered 2018-01-12 – 2018-01-20 (×16): 200 mg via ORAL
  Filled 2018-01-12 (×16): qty 1

## 2018-01-12 MED ORDER — SODIUM CHLORIDE 0.9% FLUSH
3.0000 mL | Freq: Two times a day (BID) | INTRAVENOUS | Status: DC
  Administered 2018-01-15: 3 mL via INTRAVENOUS

## 2018-01-12 MED ORDER — PRENATAL MULTIVITAMIN CH
1.0000 | ORAL_TABLET | Freq: Every day | ORAL | Status: DC
  Administered 2018-01-13 – 2018-01-17 (×5): 1 via ORAL
  Filled 2018-01-12 (×5): qty 1

## 2018-01-12 MED ORDER — ACETAMINOPHEN 325 MG PO TABS: 650.0000 mg | ORAL_TABLET | ORAL | Status: DC | PRN

## 2018-01-12 MED ORDER — HYDRALAZINE HCL 20 MG/ML IJ SOLN: 5.0000 mg | INTRAMUSCULAR | Status: DC | PRN

## 2018-01-12 MED ORDER — NIFEDIPINE ER OSMOTIC RELEASE 30 MG PO TB24
60.0000 mg | ORAL_TABLET | Freq: Every day | ORAL | Status: DC
  Administered 2018-01-13 – 2018-01-21 (×9): 60 mg via ORAL
  Filled 2018-01-12 (×10): qty 2

## 2018-01-12 MED ORDER — LABETALOL HCL 5 MG/ML IV SOLN: 40.0000 mg | INTRAVENOUS | Status: DC | PRN

## 2018-01-12 MED ORDER — SODIUM CHLORIDE 0.9 % IV SOLN: 250.0000 mL | INTRAVENOUS | Status: DC | PRN

## 2018-01-12 MED ORDER — HYDRALAZINE HCL 20 MG/ML IJ SOLN: 10.0000 mg | INTRAMUSCULAR | Status: DC | PRN

## 2018-01-12 MED ORDER — LABETALOL HCL 5 MG/ML IV SOLN: 20.0000 mg | INTRAVENOUS | Status: DC | PRN

## 2018-01-12 MED ORDER — NIFEDIPINE ER OSMOTIC RELEASE 30 MG PO TB24
30.0000 mg | ORAL_TABLET | Freq: Once | ORAL | Status: AC
  Administered 2018-01-12: 30 mg via ORAL
  Filled 2018-01-12: qty 1

## 2018-01-12 MED ORDER — ZOLPIDEM TARTRATE 5 MG PO TABS: 5.0000 mg | ORAL_TABLET | Freq: Every evening | ORAL | Status: DC | PRN

## 2018-01-12 MED ORDER — CALCIUM CARBONATE ANTACID 500 MG PO CHEW: 2.0000 | CHEWABLE_TABLET | ORAL | Status: DC | PRN

## 2018-01-12 MED ORDER — LACTATED RINGERS IV SOLN
INTRAVENOUS | Status: DC
  Administered 2018-01-12 – 2018-01-17 (×15): via INTRAVENOUS
  Administered 2018-01-17: 125 mL/h via INTRAVENOUS
  Administered 2018-01-18 (×2): via INTRAVENOUS

## 2018-01-12 MED ORDER — POTASSIUM CHLORIDE CRYS ER 20 MEQ PO TBCR
40.0000 meq | EXTENDED_RELEASE_TABLET | Freq: Two times a day (BID) | ORAL | Status: AC
  Administered 2018-01-12 – 2018-01-15 (×6): 40 meq via ORAL
  Filled 2018-01-12 (×6): qty 2

## 2018-01-12 MED ORDER — SODIUM CHLORIDE 0.9% FLUSH: 3.0000 mL | INTRAVENOUS | Status: DC | PRN

## 2018-01-12 MED ORDER — DOCUSATE SODIUM 100 MG PO CAPS
100.0000 mg | ORAL_CAPSULE | Freq: Every day | ORAL | Status: DC
  Administered 2018-01-15 – 2018-01-17 (×2): 100 mg via ORAL
  Filled 2018-01-12 (×5): qty 1

## 2018-01-12 MED ORDER — HYDRALAZINE HCL 20 MG/ML IJ SOLN
5.0000 mg | INTRAMUSCULAR | Status: DC | PRN
  Administered 2018-01-12: 5 mg via INTRAVENOUS
  Filled 2018-01-12: qty 1

## 2018-01-12 MED ORDER — ENOXAPARIN SODIUM 40 MG/0.4ML ~~LOC~~ SOLN
40.0000 mg | SUBCUTANEOUS | Status: DC
  Filled 2018-01-12: qty 0.4

## 2018-01-12 NOTE — MAU Note (Signed)
Sent from office, BP has been up, is on medication for it.  Sent over for further eval.  Pt denies HA, visual changes, epigastric pain or change in swelling.

## 2018-01-12 NOTE — H&P (Signed)
Jean Garcia is a 42 y.o. female presenting @ 27 1/[redacted] weeks gestation with known chronic HTN sent from the office for evaluation of elevated BP( r/o exacerbation vs superimposed preeclampsia. [place] labs were done and was normal but elevated ( severe range). Denies h/a, visual changes, epigastric pain.  OB History    Gravida  8   Para  5   Term  5   Preterm      AB  2   Living  5     SAB  2   TAB      Ectopic      Multiple      Live Births             Past Medical History:  Diagnosis Date  . Hypertension   . Protein S deficiency [facility])    Past Surgical History:  Procedure Laterality Date  . NO PAST SURGERIES     Family History: family history is not on file. Social History:  reports that she has never smoked. She has never used smokeless tobacco. She reports that she does not drink alcohol or use drugs.     Maternal Diabetes: No Genetic Screening: Abnormal:  Results: Elevated risk of Trisomy 21 materna 21 neg Maternal Ultrasounds/Referrals: Normal Fetal Ultrasounds or other Referrals:  None Maternal Substance Abuse:  No Significant Maternal Medications:  Meds include: Other: lovenox, procardia, labetalol Significant Maternal Lab Results:  None Other Comments:  protein S deficiency, hx gastric sleeve, chronic HTN  Review of Systems  Eyes: Negative for blurred vision.  Gastrointestinal: Negative for heartburn.  Neurological: Negative for headaches.  All other systems reviewed and are negative.  History   Blood pressure (!) 150/94, pulse 68, temperature 98.2 F (36.8 C), temperature source Oral, resp. rate 20, weight 84.8 kg, last menstrual period 07/06/2017, SpO2 99 %. Exam Physical Exam  Constitutional: She is oriented to person, place, and time. She appears well-developed and well-nourished.  HENT:  Head: Atraumatic.  Eyes: EOM are normal.  Neck: Neck supple.  Cardiovascular: Normal rate.  Respiratory: Breath sounds normal.  GI: Soft.   Musculoskeletal: She exhibits edema.  ankle  Neurological: She is alert and oriented to person, place, and time. She has normal reflexes.  Skin: Skin is warm and dry.  Psychiatric: She has a normal mood and affect.    Prenatal labs: ABO, Rh: --/--/A POS, A POS Performed at [facility], [address] [place], [place] [address]  [phone] 1951) Antibody: NEG (09/14 1951) Rubella:  Immune RPR:   NR HBsAg:  neg  HIV:   neg GBS:   not done CBC Latest Ref Rng & Units 01/12/2018 12/07/2017 12/06/2017  WBC 4.0 - 10.5 K/uL 8.4 10.1 9.2  Hemoglobin 12.0 - 15.0 g/dL 10.7(L) 9.9(L) 10.7(L)  Hematocrit 36.0 - 46.0 % 31.8(L) 30.1(L) 33.3(L)  Platelets 150 - 400 K/uL 272 327 363   CMP     Component Value Date/Time   NA 135 01/12/2018 1626   NA 145 (H) 05/19/2017 1536   K 3.1 (L) 01/12/2018 1626   CL 104 01/12/2018 1626   CO2 21 (L) 01/12/2018 1626   GLUCOSE 83 01/12/2018 1626   BUN 9 01/12/2018 1626   BUN 8 05/19/2017 1536   CREATININE 0.52 01/12/2018 1626   CREATININE 0.58 11/28/2017 1332   CALCIUM 8.4 (L) 01/12/2018 1626   PROT 6.9 01/12/2018 1626   PROT 7.6 05/19/2017 1536   ALBUMIN 3.0 (L) 01/12/2018 1626   ALBUMIN 4.1 05/19/2017 1536  AST 29 01/12/2018 1626   AST 16 11/28/2017 1332   ALT 41 01/12/2018 1626   ALT 12 11/28/2017 1332   ALKPHOS 62 01/12/2018 1626   BILITOT 0.4 01/12/2018 1626   BILITOT 0.2 (L) 11/28/2017 1332   GFRNONAA >60 01/12/2018 1626   GFRNONAA >60 11/28/2017 1332   GFRAA >60 01/12/2018 1626   GFRAA >60 11/28/2017 1332   Protein/creatinine ratio: 0.09 Assessment/Plan: Chronic HTN exacerbation 2nd trimester Protein S deficiency IUP@ 27 1/7 weeks Abnl quad screen Hypokalemia P) observation. Increase procardia XL dose. Replete K. Continue labetalol. NST q shift. Pt has appt with MFM 10/23. If remains inpt will need to keep appt. Hypertension protocol as needed  Nila Winker A Bryanah Sidell 01/12/2018, 5:40 PM

## 2018-01-12 NOTE — MAU Provider Note (Signed)
History     CC. High blood pressure  42 yo G8P5 BF with chronic HTN on procardia and labetalol @ 27 1/7 weeks sent from office for [facility] labs, serial BP due to  Elevated BP at office today. Denies h/a, visual changes, or epigastric pain. Ankle swelling unchanged  OB History    Gravida  8   Para  5   Term  5   Preterm      AB  2   Living  5     SAB  2   TAB      Ectopic      Multiple      Live Births              Past Medical History:  Diagnosis Date  . Hypertension   . Protein S deficiency [facility])     Past Surgical History:  Procedure Laterality Date  . NO PAST SURGERIES      Family History  Problem Relation Age of Onset  . Alcohol abuse Neg Hx   . Anxiety disorder Neg Hx   . Bipolar disorder Neg Hx   . Depression Neg Hx   . Drug abuse Neg Hx     Social History   Tobacco Use  . Smoking status: Never Smoker  . Smokeless tobacco: Never Used  Substance Use Topics  . Alcohol use: No  . Drug use: No    Allergies:  Allergies  Allergen Reactions  . Ibuprofen Other (See Comments)    sensitivity    Medications Prior to Admission  Medication Sig Dispense Refill Last Dose  . clotrimazole-betamethasone (LOTRISONE) cream Apply 1 application topically 2 (two) times daily as needed (eczema).    Taking  . enoxaparin (LOVENOX) 40 MG/0.4ML injection Inject 0.4 mLs (40 mg total) into the skin daily. 30 Syringe 9 Taking  . NIFEdipine (PROCARDIA-XL/ADALAT CC) 30 MG 24 hr tablet Take 1 tablet (30 mg total) by mouth daily. 30 tablet 11 Taking  . Prenatal Vit-Fe Fumarate-FA (PRENATAL VITAMIN PO) Take by mouth.   Taking     Physical Exam   Last menstrual period 07/06/2017.  General appearance: alert, cooperative and no distress Lungs: clear to auscultation bilaterally Heart: regular rate and rhythm, S1, S2 normal, no murmur, click, rub or gallop Abdomen: gravid Extremities: no edema, redness or tenderness in the calves or thighs   Tracing: baseline 140 NR  c/w gest age ED Course  IMP. Chronic HTN r/o exacerbation vs superimposed preeclampsia IUP@ 27 1/7 weeks P) [facility] labs MDM   Jean Staff, MD 3:42 PM 01/12/2018    Addendum: [facility] labs nl except low potassium. Pt however had severe range BP. Will place on obs and start  Hypertensive protocol.

## 2018-01-13 ENCOUNTER — Observation Stay (HOSPITAL_COMMUNITY): Payer: BLUE CROSS/BLUE SHIELD

## 2018-01-13 ENCOUNTER — Observation Stay (HOSPITAL_BASED_OUTPATIENT_CLINIC_OR_DEPARTMENT_OTHER): Payer: BLUE CROSS/BLUE SHIELD

## 2018-01-13 ENCOUNTER — Encounter (HOSPITAL_COMMUNITY): Payer: Self-pay

## 2018-01-13 ENCOUNTER — Encounter: Payer: Self-pay | Admitting: Hematology & Oncology

## 2018-01-13 DIAGNOSIS — O365930 Maternal care for other known or suspected poor fetal growth, third trimester, not applicable or unspecified: Secondary | ICD-10-CM

## 2018-01-13 DIAGNOSIS — O10912 Unspecified pre-existing hypertension complicating pregnancy, second trimester: Secondary | ICD-10-CM | POA: Diagnosis not present

## 2018-01-13 DIAGNOSIS — Z3A27 27 weeks gestation of pregnancy: Secondary | ICD-10-CM

## 2018-01-13 DIAGNOSIS — Z362 Encounter for other antenatal screening follow-up: Secondary | ICD-10-CM

## 2018-01-13 DIAGNOSIS — O99842 Bariatric surgery status complicating pregnancy, second trimester: Secondary | ICD-10-CM

## 2018-01-13 DIAGNOSIS — O99212 Obesity complicating pregnancy, second trimester: Secondary | ICD-10-CM

## 2018-01-13 MED ORDER — BETAMETHASONE SOD PHOS & ACET 6 (3-3) MG/ML IJ SUSP
12.0000 mg | INTRAMUSCULAR | Status: AC
  Administered 2018-01-13 – 2018-01-14 (×2): 12 mg via INTRAMUSCULAR
  Filled 2018-01-13 (×2): qty 2

## 2018-01-13 MED ORDER — HEPARIN SODIUM (PORCINE) 10000 UNIT/ML IJ SOLN
7500.0000 [IU] | Freq: Two times a day (BID) | INTRAMUSCULAR | Status: DC
  Administered 2018-01-13 – 2018-01-17 (×10): 7500 [IU] via SUBCUTANEOUS
  Filled 2018-01-13 (×11): qty 1

## 2018-01-13 NOTE — Progress Notes (Signed)
HD #2 27 2/7 weeks Chronic HTN  S: denies h/a, visual changes or epigastric pain (+) FM  O:  Patient Vitals for the past 24 hrs:  BP Temp Temp src Pulse Resp SpO2 Height Weight  01/13/18 0402 129/77 98 F (36.7 C) Oral 67 18 98 % - -  01/12/18 2325 (!) 148/85 98.3 F (36.8 C) Oral 75 18 99 % - -  01/12/18 1847 (!) 146/86 98.6 F (37 C) Oral 67 20 100 % - -  01/12/18 1840 - - - - - - 5\' 1"  (1.549 m) 84.8 kg  01/12/18 1830 (!) 166/107 - - 68 - - - -  01/12/18 1817 (!) 163/97 - - - - - - -  01/12/18 1816 (!) 163/97 - - 66 - - - -  01/12/18 1801 (!) 152/102 - - 66 - - - -  01/12/18 1747 (!) 163/101 - - 66 - - - -  01/12/18 1730 (!) 150/94 - - 68 - - - -  01/12/18 1715 (!) 163/101 - - 61 - - - -  01/12/18 1701 (!) 157/101 - - 70 - - - -  01/12/18 1645 (!) 151/89 - - 63 - - - -  01/12/18 1630 (!) 142/89 - - 66 - - - -  01/12/18 1615 123/64 - - 69 - - - -  01/12/18 1600 (!) 146/93 - - 71 - - - -  01/12/18 1554 (!) 151/96 98.2 F (36.8 C) Oral 77 20 99 % - -  01/12/18 1550 - - - - - 99 % - -  01/12/18 1547 - - - - - - - 84.8 kg  lungs clear to A  Cor RRR  Abd gravid Ext no edema. DTR 2+  [place] Mfm Fetal Bpp Wo Non Stress  Result Date: 01/13/2018 ----------------------------------------------------------------------  OBSTETRICS REPORT                        (Signed Final 01/13/2018 02:06 am) ---------------------------------------------------------------------- Patient Info  ID #:       [id number]                          D.O.B.:  12/06/1975 (41 yrs)  Name:       Jean Garcia                 Visit Date: 01/13/2018 01:32 am ---------------------------------------------------------------------- Performed By  Performed By:     Felecia Jan        Ref. Address:      [address]  [place], [place]  Attending:        Tama High MD        Location:          [facility]  Referred By:      Alanda Slim                    Otila Starn MD ---------------------------------------------------------------------- Orders   #  Description                          Code         Ordered By   1  [place] MFM OB FOLLOW UP                  76816.01     [place]                                                        Natalie Mceuen   2  [place] MFM FETAL BPP WO NON              76819.01     [place]                                            Saniya Tranchina   29  [place] MFM UA CORD DOPPLER               76820.02     Chantil Bari  ----------------------------------------------------------------------   #  Order #                    Accession #                 Episode #   1  [id number]                  [id number]   2  [id number]                  [id number]  [id number]   3  [id number]                  [id number]                  [id number]  ---------------------------------------------------------------------- Indications   Encounter for other antenatal screening        Z36.2   follow-up   [redacted] weeks gestation of pregnancy                Z3A.27   Advanced maternal age multigravida 2+,        O24.522   second trimester   Abnormal biochemical screen (quad) for         O28.9   Trisomy 21 (1:10)   Poor obstetric history: Previous gestational   O09.299   diabetes/pre eclampsia/ IUGR   Pregnancy complicated by previous gastric      O99.842   bypass, antepartum, second trimester   Obesity complicating pregnancy, second         O99.212   trimester (pregravid BMI 34)  ---------------------------------------------------------------------- Fetal Evaluation  Num Of Fetuses:          1  Fetal  Heart Rate(bpm):   138  Cardiac Activity:        Observed  Presentation:            Cephalic  Placenta:                Anterior  Amniotic Fluid  AFI FV:      Within normal limits  AFI Sum(cm)     %Tile       Largest Pocket(cm)  13.43           40          5.37  RUQ(cm)       RLQ(cm)       LUQ(cm)        LLQ(cm)  2.52          3.62          1.92           5.37 ---------------------------------------------------------------------- Biophysical Evaluation  Amniotic F.V:   Within normal limits       F. Tone:         Observed  F. Movement:    Not Observed               Score:           4/8  F. Breathing:   Not Observed ---------------------------------------------------------------------- Biometry  BPD:      67.8  mm     G. Age:  27w 2d         38  %    CI:            76  %    70 - 86                                                          FL/HC:       20.0  %    18.6 - 20.4  HC:      246.5  mm     G. Age:  26w 5d         11  %    HC/AC:       1.20  1.05 - 1.21  AC:      204.7  mm     G. Age:  25w 0d        < 3  %    FL/BPD:      72.7  %    71 - 87  FL:       49.3  mm     G. Age:  26w 4d         18  %    FL/AC:       24.1  %    20 - 24  HUM:      45.2  mm     G. Age:  26w 5d         35  %  CER:      32.4  mm     G. Age:  28w 3d         65  %  LV:        4.4  mm  Est. FW:     867   gm   1 lb 15 oz      24  % ---------------------------------------------------------------------- OB History  Gravidity:    8         Term:   5        Prem:   0        SAB:   2  TOP:          0       Ectopic:  0        Living: 5 ---------------------------------------------------------------------- Gestational Age  LMP:           27w 2d        Date:  07/06/17                 EDD:   04/12/18  U/S Today:     26w 3d                                        EDD:   04/18/18  Best:          27w 2d     Det. By:  LMP  (07/06/17)          EDD:   04/12/18 ---------------------------------------------------------------------- Anatomy  Cranium:                Appears normal         LVOT:                   Appears normal  Cavum:                 Previously seen        Aortic Arch:            Previously seen  Ventricles:            Appears normal         Ductal Arch:            Previously seen  Choroid Plexus:        Previously seen        Diaphragm:              Appears normal  Cerebellum:            Appears normal         Stomach:  Appears normal, left                                                                        sided  Posterior Fossa:       Previously seen        Abdomen:                Appears normal  Nuchal Fold:           Not applicable (>62    Abdominal Wall:         Previously seen                         wks GA)  Face:                  Appears normal         Cord Vessels:           Appears normal (3                         (orbits and profile)                           vessel cord)  Lips:                  Appears normal         Kidneys:                Appear normal  Palate:                Appears normal         Bladder:                Appears normal  Thoracic:              Appears normal         Spine:                  Previously seen  Heart:                 Appears normal         Upper Extremities:      Previously seen                         (4CH, axis, and                         situs)  RVOT:                  Appears normal         Lower Extremities:      Previously seen  Other:  Heels and 5th digit previously visualized. Nasal bone previously          visualized. Open hands previously visualized. Female gender. ---------------------------------------------------------------------- Doppler - Fetal Vessels  Umbilical Artery  RDFV                                                                      Yes ---------------------------------------------------------------------- Cervix Uterus Adnexa  Cervix  Not visualized (advanced GA >24wks)  Left Ovary  Within normal limits.   Right Ovary  Within normal limits. ---------------------------------------------------------------------- Impression  Patient with chronic hypertension was admitted for evaluation.  Fetal growth is appropriate for gestational age. Abdominal  circumference measures at less than the 5th percentile.  Amniotic fluid is normal. Fetal breathing movements and  gross body movements did not meet the crieteria. BPP 4/8.  Umbilical artery Doppler study showed reversed-end-diastolic  flow.  I reviewed NST. Miinimal variability (baseline 135 bpm) with  occasional deceleratons with recovery.  I discussed with Dr. Garwin Brothers and we arrived at a plan of  continuous monitoring for now, initiate steroid course and  repeat ultrasound (BPP) in the morning. ---------------------------------------------------------------------- Recommendations  -BPP after 6 to 8 hours.  -MFM consultation. ----------------------------------------------------------------------                  Tama High, MD Electronically Signed Final Report   01/13/2018 02:06 am ----------------------------------------------------------------------  [place] Mfm Ob Follow Up  Result Date: 01/13/2018 ----------------------------------------------------------------------  OBSTETRICS REPORT                        (Signed Final 01/13/2018 02:06 am) ---------------------------------------------------------------------- Patient Info  ID #:       [id number]                          D.O.B.:  1975/04/28 (41 yrs)  Name:       Jean Garcia                 Visit Date: 01/13/2018 01:32 am ---------------------------------------------------------------------- Performed By  Performed By:     Felecia Jan        Ref. Address:      [place]  [place], [place]  Attending:        Tama High MD        Location:          [facility]  Referred By:      Alanda Slim                    Xaivier Malay MD ---------------------------------------------------------------------- Orders   #  Description                          Code         Ordered By   1  [place] MFM OB FOLLOW UP                  76816.01     [place]                                                        Tevan Marian   2  [place] MFM FETAL BPP WO NON              76819.01     [place]                                            Athol Bolds   3  [place] MFM UA CORD DOPPLER               76820.02     Slayter Moorhouse  ----------------------------------------------------------------------   #  Order #                    Accession #                 Episode #   1  [id number]                  [id number]   2  [id number]                  [id number]   3  [id number]                  [id number]                  [id number]  ---------------------------------------------------------------------- Indications   Encounter for other antenatal screening  Z36.2   follow-up   [redacted] weeks gestation of pregnancy                Z3A.27   Advanced maternal age multigravida 42+,        O66.522   second trimester   Abnormal biochemical screen (quad) for         O28.9   Trisomy 21 (1:10)   Poor obstetric history: Previous gestational   O09.299   diabetes/pre eclampsia/ IUGR   Pregnancy complicated by previous gastric      O99.842   bypass, antepartum, second trimester   Obesity complicating pregnancy, second         O99.212   trimester (pregravid BMI 34)  ---------------------------------------------------------------------- Fetal Evaluation  Num Of Fetuses:          1  Fetal Heart Rate(bpm):   138  Cardiac  Activity:        Observed  Presentation:            Cephalic  Placenta:                Anterior  Amniotic Fluid  AFI FV:      Within normal limits  AFI Sum(cm)     %Tile       Largest Pocket(cm)  13.43           40          5.37  RUQ(cm)       RLQ(cm)       LUQ(cm)        LLQ(cm)  2.52          3.62          1.92           5.37 ---------------------------------------------------------------------- Biophysical Evaluation  Amniotic F.V:   Within normal limits       F. Tone:         Observed  F. Movement:    Not Observed               Score:           4/8  F. Breathing:   Not Observed ---------------------------------------------------------------------- Biometry  BPD:      67.8  mm     G. Age:  27w 2d         38  %    CI:            76  %    70 - 86                                                          FL/HC:       20.0  %    18.6 - 20.4  HC:      246.5  mm     G. Age:  26w 5d         11  %    HC/AC:       1.20       1.05 - 1.21  AC:      204.7  mm     G. Age:  25w 0d        < 3  %    FL/BPD:      72.7  %    71 - 87  FL:  49.3  mm     G. Age:  26w 4d         18  %    FL/AC:       24.1  %    20 - 24  HUM:      45.2  mm     G. Age:  26w 5d         35  %  CER:      32.4  mm     G. Age:  28w 3d         65  %  LV:        4.4  mm  Est. FW:     867   gm   1 lb 15 oz      24  % ---------------------------------------------------------------------- OB History  Gravidity:    8         Term:   5        Prem:   0        SAB:   2  TOP:          0       Ectopic:  0        Living: 5 ---------------------------------------------------------------------- Gestational Age  LMP:           27w 2d        Date:  07/06/17                 EDD:   04/12/18  U/S Today:     26w 3d                                        EDD:   04/18/18  Best:          27w 2d     Det. By:  LMP  (07/06/17)          EDD:   04/12/18 ---------------------------------------------------------------------- Anatomy  Cranium:               Appears normal          LVOT:                   Appears normal  Cavum:                 Previously seen        Aortic Arch:            Previously seen  Ventricles:            Appears normal         Ductal Arch:            Previously seen  Choroid Plexus:        Previously seen        Diaphragm:              Appears normal  Cerebellum:            Appears normal         Stomach:                Appears normal, left  sided  Posterior Fossa:       Previously seen        Abdomen:                Appears normal  Nuchal Fold:           Not applicable (>16    Abdominal Wall:         Previously seen                         wks GA)  Face:                  Appears normal         Cord Vessels:           Appears normal (3                         (orbits and profile)                           vessel cord)  Lips:                  Appears normal         Kidneys:                Appear normal  Palate:                Appears normal         Bladder:                Appears normal  Thoracic:              Appears normal         Spine:                  Previously seen  Heart:                 Appears normal         Upper Extremities:      Previously seen                         (4CH, axis, and                         situs)  RVOT:                  Appears normal         Lower Extremities:      Previously seen  Other:  Heels and 5th digit previously visualized. Nasal bone previously          visualized. Open hands previously visualized. Female gender. ---------------------------------------------------------------------- Doppler - Fetal Vessels  Umbilical Artery                                                                    RDFV  Yes ---------------------------------------------------------------------- Cervix Uterus Adnexa  Cervix  Not visualized (advanced GA >24wks)  Left Ovary  Within normal limits.  Right Ovary  Within normal  limits. ---------------------------------------------------------------------- Impression  Patient with chronic hypertension was admitted for evaluation.  Fetal growth is appropriate for gestational age. Abdominal  circumference measures at less than the 5th percentile.  Amniotic fluid is normal. Fetal breathing movements and  gross body movements did not meet the crieteria. BPP 4/8.  Umbilical artery Doppler study showed reversed-end-diastolic  flow.  I reviewed NST. Miinimal variability (baseline 135 bpm) with  occasional deceleratons with recovery.  I discussed with Dr. Garwin Brothers and we arrived at a plan of  continuous monitoring for now, initiate steroid course and  repeat ultrasound (BPP) in the morning. ---------------------------------------------------------------------- Recommendations  -BPP after 6 to 8 hours.  -MFM consultation. ----------------------------------------------------------------------                  Tama High, MD Electronically Signed Final Report   01/13/2018 02:06 am ----------------------------------------------------------------------  [place] Mfm Ob Follow Up  Result Date: 12/17/2017 ----------------------------------------------------------------------  OBSTETRICS REPORT                       (Signed Final 12/17/2017 05:02 pm) ---------------------------------------------------------------------- Patient Info  ID #:       [id number]                          D.O.B.:  08/24/1975 (41 yrs)  Name:       Jean Garcia                 Visit Date: 12/17/2017 04:05 pm ---------------------------------------------------------------------- Performed By  Performed By:     Elisabeth Cara        Ref. Address:     [place], [place]  [place]  Attending:        Sander Nephew      Location:         [facility]                    MD  Referred By:      Alanda Slim                    Dameka Younker MD ---------------------------------------------------------------------- Orders   #  Description                          Code         Ordered By   1  [place] MFM OB FOLLOW UP                  76816.01     Lora Paula  ----------------------------------------------------------------------   #  Order #                    Accession #                 Episode #   1  [id number]                  [id number]                  [id number]  ---------------------------------------------------------------------- Indications   Encounter for other antenatal screening        Z36.2   follow-up   Advanced maternal age multigravida 57+,        O30.522   second trimester   Abnormal biochemical screen (quad) for         O28.9   Trisomy 21 (1:10)   Poor obstetric history: Previous gestational   O09.299   diabetes/pre eclampsia/ IUGR   Pregnancy complicated by previous gastric      O99.842   bypass, antepartum, second trimester   Obesity complicating pregnancy, second         O99.212   trimester (pregravid BMI 34)   [redacted] weeks gestation of pregnancy                Z3A.23  ---------------------------------------------------------------------- Fetal Evaluation  Num Of Fetuses:         1  Fetal Heart Rate(bpm):  157  Cardiac Activity:       Observed  Presentation:           Cephalic  Placenta:               Anterior  P. Cord Insertion:      Previously Visualized  Amniotic Fluid  AFI FV:      Within normal limits                              Largest Pocket(cm)                              4.49 ---------------------------------------------------------------------- Biometry  BPD:      54.2  mm     G. Age:  22w 3d         14  %    CI:        68.24   %    70 - 86  FL/HC:      18.9   %    19.2 - 20.8  HC:      209.8  mm     G. Age:  23w 1d         22  %    HC/AC:      1.17        1.05 - 1.21  AC:       179   mm     G. Age:  22w 6d         22  %    FL/BPD:     73.2   %    71 - 87  FL:       39.7  mm     G. Age:  22w 5d         20  %    FL/AC:      22.2   %    20 - 24  HUM:      36.8  mm     G. Age:  22w 6d         30  %  Est. FW:     535  gm      1 lb 3 oz     38  % ---------------------------------------------------------------------- OB History  Gravidity:    8         Term:   5        Prem:   0        SAB:   2  TOP:          0       Ectopic:  0        Living: 5 ---------------------------------------------------------------------- Gestational Age  LMP:           23w 3d        Date:  07/06/17                 EDD:   04/12/18  U/S Today:     22w 6d                                        EDD:   04/16/18  Best:          23w 3d     Det. By:  LMP  (07/06/17)          EDD:   04/12/18 ---------------------------------------------------------------------- Anatomy  Cranium:               Appears normal         LVOT:                   Appears normal  Cavum:                 Previously seen        Aortic Arch:            Previously seen  Ventricles:            Appears normal         Ductal Arch:            Previously seen  Choroid Plexus:        Previously seen        Diaphragm:              Previously seen  Cerebellum:  Previously seen        Stomach:                Appears normal, left                                                                        sided  Posterior Fossa:       Previously seen        Abdomen:                Prominent Bowel                                                                        but not echogenic  Nuchal Fold:           Not applicable (>19    Abdominal Wall:         Previously seen                         wks GA)  Face:                  Orbits and profile     Cord Vessels:           Previously seen                          previously seen  Lips:                  Previously seen        Kidneys:                Appear normal  Palate:                Previously seen        Bladder:                Appears normal  Thoracic:              Appears normal         Spine:                  Previously seen  Heart:                 Appears normal         Upper Extremities:      Previously seen                         (4CH, axis, and                         situs)  RVOT:                  Appears normal         Lower Extremities:      Previously seen  Other:  Heels  and 5th digit previously visualized. Nasal bone previously          visualized. Open hands previously visualized. ---------------------------------------------------------------------- Cervix Uterus Adnexa  Cervix  Not visualized (advanced GA >24wks)  Uterus  No abnormality visualized.  Left Ovary  Within normal limits.  Right Ovary  Not visualized.  Adnexa  No abnormality visualized. No adnexal mass  visualized. ---------------------------------------------------------------------- Comments  Ms. Heady is here for follow up growth given prior abnormal  quad with normal NIPS. Today we observed prominent bowel  but it was not echogenic.  I reviewed the significance of echogenic bowel along with  today's images. I reviewed managment and evaluation. At  this time she declined further testing and desires serial  growth examination. ---------------------------------------------------------------------- Impression  AMA  Chronic Hypertension  Abnormal serum screening with normal NIPS  Prominent bowel ---------------------------------------------------------------------- Recommendations  Continue serial growth every 4 weeks. ----------------------------------------------------------------------               Sander Nephew, MD Electronically Signed Final Report   12/17/2017 05:02 pm ----------------------------------------------------------------------  [place] Mfm Ua Cord Doppler  Result  Date: 01/13/2018 ----------------------------------------------------------------------  OBSTETRICS REPORT                        (Signed Final 01/13/2018 02:06 am) ---------------------------------------------------------------------- Patient Info  ID #:       [id number]                          D.O.B.:  07/10/1975 (41 yrs)  Name:       Jean Garcia                 Visit Date: 01/13/2018 01:32 am ---------------------------------------------------------------------- Performed By  Performed By:     Felecia Jan        Ref. Address:      [place], [place]  [place]  Attending:        Tama High MD        Location:          [facility]  Referred By:      Alanda Slim                    Manika Hast MD ---------------------------------------------------------------------- Orders   #  Description                          Code         Ordered By   1  [place] MFM OB FOLLOW UP                  76816.01     [place]                                                        Alira Fretwell   2  [place] MFM FETAL BPP WO NON              76819.01     [place]      [place]                                            Kimbery Harwood   80  [place] MFM UA CORD DOPPLER               76820.02     Gemma Ruan  ----------------------------------------------------------------------   #  Order #                    Accession #                 Episode #   1  [id number]                  [id number]   2  [id number]                  [id number]   3  [id number]                  [id number]                   [id number]  ---------------------------------------------------------------------- Indications   Encounter for other antenatal screening        Z36.2   follow-up   [redacted] weeks gestation of pregnancy                Z3A.27   Advanced maternal age multigravida 62+,        O62.522   second trimester   Abnormal biochemical screen (quad) for  O28.9   Trisomy 21 (1:10)   Poor obstetric history: Previous gestational   O09.299   diabetes/pre eclampsia/ IUGR   Pregnancy complicated by previous gastric      O99.842   bypass, antepartum, second trimester   Obesity complicating pregnancy, second         O99.212   trimester (pregravid BMI 34)  ---------------------------------------------------------------------- Fetal Evaluation  Num Of Fetuses:          1  Fetal Heart Rate(bpm):   138  Cardiac Activity:        Observed  Presentation:            Cephalic  Placenta:                Anterior  Amniotic Fluid  AFI FV:      Within normal limits  AFI Sum(cm)     %Tile       Largest Pocket(cm)  13.43           40          5.37  RUQ(cm)       RLQ(cm)       LUQ(cm)        LLQ(cm)  2.52          3.62          1.92           5.37 ---------------------------------------------------------------------- Biophysical Evaluation  Amniotic F.V:   Within normal limits       F. Tone:         Observed  F. Movement:    Not Observed               Score:           4/8  F. Breathing:   Not Observed ---------------------------------------------------------------------- Biometry  BPD:      67.8  mm     G. Age:  27w 2d         38  %    CI:            76  %    70 - 86                                                          FL/HC:       20.0  %    18.6 - 20.4  HC:      246.5  mm     G. Age:  26w 5d         11  %    HC/AC:       1.20       1.05 - 1.21  AC:      204.7  mm     G. Age:  25w 0d        < 3  %    FL/BPD:      72.7  %    71 - 87  FL:       49.3  mm     G. Age:  26w 4d         18  %    FL/AC:       24.1  %    20 - 24  HUM:      45.2  mm  G. Age:  26w 5d         35  %  CER:      32.4  mm     G. Age:  28w 3d         36  %  LV:        4.4  mm  Est. FW:     867   gm   1 lb 15 oz      24  % ---------------------------------------------------------------------- OB History  Gravidity:    8         Term:   5        Prem:   0        SAB:   2  TOP:          0       Ectopic:  0        Living: 5 ---------------------------------------------------------------------- Gestational Age  LMP:           27w 2d        Date:  07/06/17                 EDD:   04/12/18  U/S Today:     26w 3d                                        EDD:   04/18/18  Best:          27w 2d     Det. By:  LMP  (07/06/17)          EDD:   04/12/18 ---------------------------------------------------------------------- Anatomy  Cranium:               Appears normal         LVOT:                   Appears normal  Cavum:                 Previously seen        Aortic Arch:            Previously seen  Ventricles:            Appears normal         Ductal Arch:            Previously seen  Choroid Plexus:        Previously seen        Diaphragm:              Appears normal  Cerebellum:            Appears normal         Stomach:                Appears normal, left                                                                        sided  Posterior Fossa:       Previously seen        Abdomen:  Appears normal  Nuchal Fold:           Not applicable (>54    Abdominal Wall:         Previously seen                         wks GA)  Face:                  Appears normal         Cord Vessels:           Appears normal (3                         (orbits and profile)                           vessel cord)  Lips:                  Appears normal         Kidneys:                Appear normal  Palate:                Appears normal         Bladder:                Appears normal  Thoracic:              Appears normal         Spine:                  Previously seen  Heart:                 Appears normal          Upper Extremities:      Previously seen                         (4CH, axis, and                         situs)  RVOT:                  Appears normal         Lower Extremities:      Previously seen  Other:  Heels and 5th digit previously visualized. Nasal bone previously          visualized. Open hands previously visualized. Female gender. ---------------------------------------------------------------------- Doppler - Fetal Vessels  Umbilical Artery                                                                    RDFV                                                                      Yes ---------------------------------------------------------------------- Cervix Uterus  Adnexa  Cervix  Not visualized (advanced GA >24wks)  Left Ovary  Within normal limits.  Right Ovary  Within normal limits. ---------------------------------------------------------------------- Impression  Patient with chronic hypertension was admitted for evaluation.  Fetal growth is appropriate for gestational age. Abdominal  circumference measures at less than the 5th percentile.  Amniotic fluid is normal. Fetal breathing movements and  gross body movements did not meet the crieteria. BPP 4/8.  Umbilical artery Doppler study showed reversed-end-diastolic  flow.  I reviewed NST. Miinimal variability (baseline 135 bpm) with  occasional deceleratons with recovery.  I discussed with Dr. Garwin Brothers and we arrived at a plan of  continuous monitoring for now, initiate steroid course and  repeat ultrasound (BPP) in the morning. ---------------------------------------------------------------------- Recommendations  -BPP after 6 to 8 hours.  -MFM consultation. ----------------------------------------------------------------------                  Tama High, MD Electronically Signed Final Report   01/13/2018 02:06 am ---------------------------------------------------------------------- tracing: baseline 140   IMP: chronic hypertension  exacerbation abnl doppler/BPP Hypokalemia on K supplement Hx gastric sleeve IUP@ 27 2/7 weeks Protein S deficiency on lovenox Given gestational age, will try to keep pt pregnant as far as possible. Disc the issue and concerns with pt and poss need for preterm delivery P) cont with co management with MFM. Cont BP med. NICU consult. BMZ. Magnesium for neuro prophylaxis when imminent delivery suspected,  BP meds. Daily BPP/dopplers. continuous fetal  Monitoring, deliver for concerning tracing. Repeat BPP/dopplers in am. Change to heparin for anticoagulation mgmt

## 2018-01-13 NOTE — Consult Note (Signed)
Maternal-Fetal Medicine Name: Jean Garcia MRN: [id number] Referring Provider: Servando Salina, MD  I had the pleasure of seeing [name] today at [facility]. Patient, [name] at 27w 2d gestation, is admitted with increased blood pressures and had nonreassuring NST last night. She had a follow-up BPP and Doppler studies that were abnormal (reversed-end-diastolic flow).  Patient has a diagnosis of chronic hypertension that was detected at 15 to 16-weeks' gestation. She reports she has not had hypertension before pregnancy. For about a month, she was taking nifedipine XL 30 mg daily and labetalol 200 mg bid was added last week.  Review of systems: She does not have symptoms of severe headache or visual disturbances or right upper quadrant pain or vaginal bleeding. She reports good fetal movements now. No chest pain or shortness of breath or palpitations. No abdominal or joint pains. No recurrent urinary infections.  Since admission, nifedipine XL dosage was increased to 60 mg daily. She received first dose of betamethasone. PMH: Chronic hypertension. She also reports Protein S deficiency and is on lovenox prophylaxis. PSH: Gastric sleeve surgery 4 years ago. Patient lost about 100 lbs since surgery.  Medications: Prenatal vitamins, nifedipine XL, labetalol, lovenox 40 mg daily. Allergies: NKDA. Social: Denies tobacco or drug or alcohol use. She has been married 21 years and her husband is in good health. He is the father of all her children. Family: Both her parents are healthy (no hypertension). No history of venous thromboembolism in the family. Obstetric history is significant for 5 previous term vaginal deliveries. Her most-recent delivery was complicated by eclampsia (7 years ago). Gyn: No history of cervical surgeries.  P/E: Patient is comfortable; not in distress. BP since admission: 123-166/64-107 mm Hg. Abd: Soft gravid uterus; no tenderness. NST: Minimal  variability with infrequent decelerations including one down to 60 bpm (3:30 AM) with good recovery. Labs: Hb 10.7, Hct 31.8, WBC 8.4, PLT 272, elecctrolytes normal, ALT 41, AST 29, creatinine 0.52. P/C ratio 0.09.  On ultrasound, amniotic fluid is normal and good fetal activity is seen. Fetal breathing movements did not meet the criteria. Umbilical artery Doppler showed mostly absent-end-diastolic flow (with occasional reversed flow). Ductus venosus Doppler was normal. BPP 6/8.   I counseled the patient on the following: Chronic hypertension:  Very high blood pressures suggest the possibility of superimposed preeclampsia. Possible complications including stroke, end-organ failure and coagulation disturbances and placental abruption. Our goal is to reduce maternal blood pressure and aim to keep the SBP below 155 mm Hg and DBP below 105 mm Hg. I briefly discussed antihypertensive management.  I discussed the significance of abnormal Doppler studies (no growth restriction) suggesting placental insufficiency. Frequency of monitoring is dependent on the presence of decelerations. I recommend Doppler studies on alternate days now. Timing of delivery will be based on BPP, Doppler studies and NST. I discussed with Dr. Garwin Garcia. Recommendations: -Inpatient management to continue. -Aim to keep SBP below 155 mm Hg and DBP below 105 mm Hg. -Continue nifedipine XL and labetalol and adjust dosage as necessary. -Continuous NST monitoring for now. If persistent decelerations are seen, delivery should be considered. -Complete betamethasone course. -Discontinue lovenox and initiate unfractionated heparin 7,500 units subcut twice daily. -BPP tomorrow and then Mon-Wed-Fri if reversed flow is seen; twice weekly if only absent EDF is seen.  Thank you for your consult. Please do not hesitate to contact me if you have any questions or concerns.  Consultation including face-to-face counseling: 40 min.

## 2018-01-13 NOTE — Progress Notes (Addendum)
At 2219 10/21 Dr. Garwin Brothers was paged due to Pt having FHR w/ minimal/absent variability and no accelerations during NST. Pt was kept on [place] monitor until Dr. Garwin Brothers reviewed the strip and called back at 2254. Monitors were removed at that time. Pt will go to u/s for BPP stat per order of Dr. Garwin Brothers.

## 2018-01-14 ENCOUNTER — Ambulatory Visit (HOSPITAL_COMMUNITY)
Admission: RE | Admit: 2018-01-14 | Discharge: 2018-01-14 | Disposition: A | Discharge status: Home / Self Care | Payer: BLUE CROSS/BLUE SHIELD | Source: Ambulatory Visit | Attending: Obstetrics and Gynecology | Admitting: Obstetrics and Gynecology

## 2018-01-14 ENCOUNTER — Observation Stay (HOSPITAL_BASED_OUTPATIENT_CLINIC_OR_DEPARTMENT_OTHER): Payer: BLUE CROSS/BLUE SHIELD

## 2018-01-14 ENCOUNTER — Encounter (HOSPITAL_COMMUNITY): Payer: Self-pay

## 2018-01-14 DIAGNOSIS — O99212 Obesity complicating pregnancy, second trimester: Secondary | ICD-10-CM

## 2018-01-14 DIAGNOSIS — O99842 Bariatric surgery status complicating pregnancy, second trimester: Secondary | ICD-10-CM

## 2018-01-14 DIAGNOSIS — Z3A27 27 weeks gestation of pregnancy: Secondary | ICD-10-CM

## 2018-01-14 DIAGNOSIS — O10012 Pre-existing essential hypertension complicating pregnancy, second trimester: Secondary | ICD-10-CM

## 2018-01-14 DIAGNOSIS — O09522 Supervision of elderly multigravida, second trimester: Secondary | ICD-10-CM

## 2018-01-14 DIAGNOSIS — O09292 Supervision of pregnancy with other poor reproductive or obstetric history, second trimester: Secondary | ICD-10-CM

## 2018-01-14 NOTE — Progress Notes (Signed)
S; (+) FM Husband and children just arrived for visit Denies h/a, visual change or epigastric pain   O;  Patient Vitals for the past 24 hrs:  BP Temp Temp src Pulse Resp SpO2 Weight  01/14/18 1607 126/75 98.4 F (36.9 C) Oral 83 18 98 % -  01/14/18 1229 (!) 144/73 98.1 F (36.7 C) Oral 70 18 97 % -  01/14/18 0755 123/80 98.3 F (36.8 C) Oral 75 18 97 % -  01/14/18 0409 (!) 148/87 98.5 F (36.9 C) Oral 75 16 96 % 86.8 kg  01/13/18 2355 (!) 142/84 98.3 F (36.8 C) Oral 73 16 99 % -  01/13/18 2002 130/62 98.1 F (36.7 C) - 78 20 100 % -  lungs clear to A Cor RRR Abd gravid Pelvic deferred extr no edema or calf tenderness Tracing baseline 145-150 varying variability    [place] Mfm Fetal Bpp Wo Non Stress  Result Date: 01/14/2018 ----------------------------------------------------------------------  OBSTETRICS REPORT                       (Signed Final 01/14/2018 10:23 am) ---------------------------------------------------------------------- Patient Info  ID #:       [id number]                          D.O.B.:  1976-01-09 (41 yrs)  Name:       Jean Garcia                 Visit Date: 01/14/2018 06:58 am ---------------------------------------------------------------------- Performed By  Performed By:     Corky Crafts             Ref. Address:     [place], [place]  [place]  Attending:        Tama High MD        Secondary Phy.:   3rd Nursing- HR                                                             OB                                                             3rd Floor  Referred By:      Alanda Slim              Location:         [facility]                    Maylene Crocker MD ---------------------------------------------------------------------- Orders   #  Description                          Code         Ordered By   1  [place] MFM UA CORD DOPPLER               76820.02     [place]                                                        Neri Vieyra   2  [place] MFM FETAL BPP WO NON              76819.01     Rodolphe Edmonston      STRESS                                            Carrol Bondar  ----------------------------------------------------------------------   #  Order #                    Accession #                 Episode #   1  [id number]                  [id number]                  [id number]   2  [id number]                  [id number]                  [id number]  ---------------------------------------------------------------------- Indications   Hypertension - Chronic/Pre-existing            O10.019   (labetalol)   [redacted] weeks gestation of pregnancy                Z3A.27   Advanced maternal age multigravida 58+,  S50.539   second trimester   Abnormal biochemical screen (quad) for         O28.9   Trisomy 21 (1:10)   Poor obstetric history: Previous gestational   O09.299   diabetes/pre eclampsia/ IUGR   Pregnancy complicated by previous gastric      O99.842   bypass, antepartum, second trimester   Obesity complicating pregnancy, second         O99.212   trimester (pregravid BMI 34)  ---------------------------------------------------------------------- Vital Signs  Weight (lb): 191                               Height:        5'1"  BMI:         36.09 ---------------------------------------------------------------------- Fetal Evaluation  Num Of Fetuses:         1  Fetal Heart Rate(bpm):  138  Cardiac Activity:       Observed  Presentation:           Cephalic  Placenta:               Anterior  P. Cord Insertion:      Previously Visualized  Amniotic Fluid  AFI FV:      Within normal limits  AFI Sum(cm)     %Tile       Largest Pocket(cm)   13.13           37          4.09  RUQ(cm)       RLQ(cm)       LUQ(cm)        LLQ(cm)  4.09          3.93          2.72           2.39 ---------------------------------------------------------------------- Biophysical Evaluation  Amniotic F.V:   Within normal limits       F. Tone:        Observed  F. Movement:    Observed                   Score:          8/8  F. Breathing:   Observed ---------------------------------------------------------------------- OB History  Gravidity:    8         Term:   5        Prem:   0        SAB:   2  TOP:          0       Ectopic:  0        Living: 5 ---------------------------------------------------------------------- Gestational Age  LMP:           27w 3d        Date:  07/06/17                 EDD:   04/12/18  Best:          27w 3d     Det. By:  LMP  (07/06/17)          EDD:   04/12/18 ---------------------------------------------------------------------- Doppler - Fetal Vessels  Umbilical Artery   S/D     %tile  ADFV    RDFV  4.94    > 97.5                                              Yes      No ---------------------------------------------------------------------- Cervix Uterus Adnexa  Cervix  Not visualized (advanced GA >24wks) ---------------------------------------------------------------------- Impression  Patient returned for BPP and Doppler studies. She completed  betamethasone course. Her blood pressures are in the  normal to milder hypertensive range. She is taking nifedipine  XL 60 mg daily and labetalol 200 mg tid.  I reviewed the NST that has improved (good variability).  On ultrasound, amniotic fluid is normal and good fetal activity  is seen. Cephalic presentation. Antenatal testing is  reassuring. BPP 8/8. Umbilical artery Doppler showed  intermittent absent-end-diastolic flow. ---------------------------------------------------------------------- Recommendations  -BPP and Doppler on 01/16/18.  -Frequency of antenatal  surveillance to be decided on her  next visit. ----------------------------------------------------------------------                  Tama High, MD Electronically Signed Final Report   01/14/2018 10:23 am ----------------------------------------------------------------------  [place] Mfm Ua Cord Doppler  Result Date: 01/14/2018 ----------------------------------------------------------------------  OBSTETRICS REPORT                       (Signed Final 01/14/2018 10:23 am) ---------------------------------------------------------------------- Patient Info  ID #:       [id number]                          D.O.B.:  1975-06-12 (41 yrs)  Name:       Jean Garcia                 Visit Date: 01/14/2018 06:58 am ---------------------------------------------------------------------- Performed By  Performed By:     Corky Crafts             Ref. Address:     [address]  [place], [place]  Attending:        Tama High MD        Secondary Phy.:   3rd Nursing- HR                                                             OB                                                             3rd Floor  Referred By:      Alanda Slim             Location:         [facility]                    Yanil Dawe MD ---------------------------------------------------------------------- Orders   #  Description                          Code         Ordered By   1  [place] MFM UA CORD DOPPLER               76820.02     [place]                                                        Cordelro Gautreau   2  [place] MFM FETAL BPP WO NON              76819.01     Sayer Masini      STRESS                                             Delcenia Inman  ----------------------------------------------------------------------   #  Order #                    Accession #                 Episode #   1  [id number]                  [id number]                  [id number]   2  [id number]                  [id number]  [id number]  ---------------------------------------------------------------------- Indications   Hypertension - Chronic/Pre-existing            O10.019   (labetalol)   [redacted] weeks gestation of pregnancy                Z3A.27   Advanced maternal age multigravida 18+,        O72.522   second trimester   Abnormal biochemical screen (quad) for         O28.9   Trisomy 21 (1:10)   Poor obstetric history: Previous gestational   O09.299   diabetes/pre eclampsia/ IUGR   Pregnancy complicated by previous gastric      O99.842   bypass, antepartum, second trimester   Obesity complicating pregnancy, second         O99.212   trimester (pregravid BMI 34)  ---------------------------------------------------------------------- Vital Signs  Weight (lb): 191                               Height:        5'1"  BMI:         36.09 ---------------------------------------------------------------------- Fetal Evaluation  Num Of Fetuses:         1  Fetal Heart Rate(bpm):  138  Cardiac Activity:       Observed  Presentation:           Cephalic  Placenta:               Anterior  P. Cord Insertion:      Previously Visualized  Amniotic Fluid  AFI FV:      Within normal limits  AFI Sum(cm)     %Tile       Largest Pocket(cm)  13.13           37          4.09  RUQ(cm)       RLQ(cm)       LUQ(cm)        LLQ(cm)  4.09          3.93          2.72           2.39 ---------------------------------------------------------------------- Biophysical Evaluation  Amniotic F.V:   Within normal limits       F. Tone:        Observed  F. Movement:    Observed                   Score:          8/8  F. Breathing:   Observed  ---------------------------------------------------------------------- OB History  Gravidity:    8         Term:   5        Prem:   0        SAB:   2  TOP:          0       Ectopic:  0        Living: 5 ---------------------------------------------------------------------- Gestational Age  LMP:           27w 3d        Date:  07/06/17                 EDD:   04/12/18  Best:          27w 3d     Det. By:  LMP  (07/06/17)  EDD:   04/12/18 ---------------------------------------------------------------------- Doppler - Fetal Vessels  Umbilical Artery   S/D     %tile                                            ADFV    RDFV  4.94    > 97.5                                              Yes      No ---------------------------------------------------------------------- Cervix Uterus Adnexa  Cervix  Not visualized (advanced GA >24wks) ---------------------------------------------------------------------- Impression  Patient returned for BPP and Doppler studies. She completed  betamethasone course. Her blood pressures are in the  normal to milder hypertensive range. She is taking nifedipine  XL 60 mg daily and labetalol 200 mg tid.  I reviewed the NST that has improved (good variability).  On ultrasound, amniotic fluid is normal and good fetal activity  is seen. Cephalic presentation. Antenatal testing is  reassuring. BPP 8/8. Umbilical artery Doppler showed  intermittent absent-end-diastolic flow. ---------------------------------------------------------------------- Recommendations  -BPP and Doppler on 01/16/18.  -Frequency of antenatal surveillance to be decided on her  next visit. ----------------------------------------------------------------------                  Tama High, MD Electronically Signed Final Report   01/14/2018 10:23 am ---------------------------------------------------------------------- IMP; chronic HTN 2) BMZ complete 3) protein S deficiency 4) hx gastric sleeve  4) abnl quad screen P)continue  inpt mgmt. Reviewed with pt and husband what is different from her last admit. The reason for remaining inpt. The hope to get to 28 weeks. The anticipation for C/S due to  Concern baby would not tolerate labor in an already stressful setting. Disc need to increase po fluid intake. Cont antihypertensive meds.

## 2018-01-14 NOTE — Progress Notes (Signed)
Phoned results of MFM ultrasound to Dr. Garwin Brothers.  Also notified her of variable seen in EFM in last hour.  Descent to nadir of 100 bpm over 1 minute with brisk return to baseline.  Moderate variability and 10x10 accelerations.  No new orders received.

## 2018-01-14 NOTE — Consult Note (Signed)
Neonatology Consult Note:  At the request of the patients obstetrician Dr. Garwin Brothers I met with Jean Garcia who is 27 [redacted] weeks pregnant.  She was admitted with increased blood pressures and Doppler studies show reversed-end-diastolic flow.  She has a diagnosis of chronic hypertension which is managed with nifedipine and labetalol. She is receiving a course of betamethasone with plan for Magnesium for neuro prophylaxis when imminent delivery suspected.     We discussed morbidity/mortality at this gestional age, delivery room resuscitation, including intubation and surfactant in DR.  Discussed mechanical ventilation and risk for chronic lung disease, risk for IVH with potential for motor / cognitive deficits, ROP, NEC, sepsis, as well as temperature instability and feeding immaturity.  Discussed NG / OG feeds, benefits of MBM in reducing incidence of NEC.   Discussed likely length of stay.  Thank you for allowing [place] to participate in her care.  Please call with questions.  Higinio Roger, DO  Neonatologist  The total length of face-to-face or floor / unit time for this encounter was 25 minutes.  Counseling and / or coordination of care was greater than fifty percent of the time.

## 2018-01-15 ENCOUNTER — Inpatient Hospital Stay (HOSPITAL_COMMUNITY): Payer: BLUE CROSS/BLUE SHIELD

## 2018-01-15 ENCOUNTER — Inpatient Hospital Stay (HOSPITAL_BASED_OUTPATIENT_CLINIC_OR_DEPARTMENT_OTHER): Payer: BLUE CROSS/BLUE SHIELD

## 2018-01-15 DIAGNOSIS — O10012 Pre-existing essential hypertension complicating pregnancy, second trimester: Secondary | ICD-10-CM

## 2018-01-15 DIAGNOSIS — O09292 Supervision of pregnancy with other poor reproductive or obstetric history, second trimester: Secondary | ICD-10-CM

## 2018-01-15 DIAGNOSIS — O99842 Bariatric surgery status complicating pregnancy, second trimester: Secondary | ICD-10-CM

## 2018-01-15 DIAGNOSIS — O09522 Supervision of elderly multigravida, second trimester: Secondary | ICD-10-CM

## 2018-01-15 DIAGNOSIS — O99212 Obesity complicating pregnancy, second trimester: Secondary | ICD-10-CM

## 2018-01-15 DIAGNOSIS — Z3A27 27 weeks gestation of pregnancy: Secondary | ICD-10-CM

## 2018-01-15 DIAGNOSIS — O289 Unspecified abnormal findings on antenatal screening of mother: Secondary | ICD-10-CM

## 2018-01-15 NOTE — Progress Notes (Signed)
BP 160/91. Dr. Garwin Brothers paged. Will await response. Toya Smothers, RN

## 2018-01-15 NOTE — Progress Notes (Signed)
HD#4  c/o peeing  Every hour Denies h/a, visual changes or epigastric pain Active fetus  O:  Patient Vitals for the past 24 hrs:  BP Temp Temp src Pulse Resp SpO2 Weight  01/15/18 1148 135/81 98.2 F (36.8 C) Oral 65 18 97 % -  01/15/18 0930 (!) 150/83 - - 73 18 96 % -  01/15/18 0828 (!) 160/91 97.8 F (36.6 C) Oral 65 18 96 % -  01/15/18 0625 - - - - - - 88.6 kg  01/15/18 0422 (!) 156/84 98.4 F (36.9 C) Oral 65 20 95 % -  01/15/18 0002 132/76 98.7 F (37.1 C) Oral 74 20 99 % -  01/14/18 2008 (!) 144/72 99.5 F (37.5 C) Oral 75 20 98 % -  01/14/18 1607 126/75 98.4 F (36.9 C) Oral 83 18 98 % -  lungs clear to A Cor RRR  Abd gravid soft Pelvic deferred Ext no edema DTR 2+  Tracing: baseline 145 no decel No ctx  IMP: chronic HTN on med abnl dopplers Protein S deficiency on heparin Hypokalemia BMZ complete IUP@ 27 4/7 Hx gastric sleeve P) cont inpt. BPP,dopplers in am. Repeat PIH labs tomorrow cont monitoring as ordered. Seen by NICU

## 2018-01-15 NOTE — Progress Notes (Signed)
Pt stood to be weighed while a deceleration appeared to be occurring. Pt wanted to go to bathroom before monitors were reapplied despite nurse asking to return to bed for monitoring. Pt educated on importance of monitoring. Monitors reapplied.

## 2018-01-15 NOTE — Progress Notes (Addendum)
Dr. Garwin Brothers notified and asked to review strip. BPP and Doppler ordered. Will continue to monitor

## 2018-01-15 NOTE — Progress Notes (Signed)
Contact with Dr.Cousins. Instructed to given 10am bp medications now.  Labetalol and Procardia given. Will recheck BP in 1 hour. Toya Smothers, RN

## 2018-01-15 NOTE — Progress Notes (Signed)
Baseline change for 8 minutes from 140 to 120 but with moderate variability during that time. Baseline returned to 140 with a decrease in variability at this time. Will continue to monitor. Toya Smothers, RN

## 2018-01-16 ENCOUNTER — Inpatient Hospital Stay (HOSPITAL_BASED_OUTPATIENT_CLINIC_OR_DEPARTMENT_OTHER): Payer: BLUE CROSS/BLUE SHIELD

## 2018-01-16 DIAGNOSIS — O99842 Bariatric surgery status complicating pregnancy, second trimester: Secondary | ICD-10-CM

## 2018-01-16 DIAGNOSIS — O99212 Obesity complicating pregnancy, second trimester: Secondary | ICD-10-CM

## 2018-01-16 DIAGNOSIS — Z3A27 27 weeks gestation of pregnancy: Secondary | ICD-10-CM

## 2018-01-16 DIAGNOSIS — O09522 Supervision of elderly multigravida, second trimester: Secondary | ICD-10-CM

## 2018-01-16 DIAGNOSIS — O10012 Pre-existing essential hypertension complicating pregnancy, second trimester: Secondary | ICD-10-CM

## 2018-01-16 DIAGNOSIS — O289 Unspecified abnormal findings on antenatal screening of mother: Secondary | ICD-10-CM

## 2018-01-16 DIAGNOSIS — O09292 Supervision of pregnancy with other poor reproductive or obstetric history, second trimester: Secondary | ICD-10-CM

## 2018-01-16 NOTE — Progress Notes (Signed)
HD # 5 27 4/7 weeks S: no complaints. Notes good FM   O:  Patient Vitals for the past 24 hrs:  BP Temp Temp src Pulse Resp SpO2 Weight  01/16/18 1312 130/77 98.8 F (37.1 C) Oral 67 18 96 % -  01/16/18 0837 (!) 149/81 - - 60 18 98 % -  01/16/18 0500 - - - - - - 83.9 kg  01/16/18 0314 135/84 98.5 F (36.9 C) Oral 71 18 97 % -  01/15/18 2325 (!) 145/89 98.6 F (37 C) Oral 69 17 95 % -  01/15/18 1955 116/70 98.8 F (37.1 C) Oral 77 19 95 % -  01/15/18 1614 139/88 98.5 F (36.9 C) Oral 72 20 96 % -   tracing: baseline 145-150 good variability No ctx no decels    IMP: chronic HTN w/o superimposed preeclampsia on labetalol and procardia Protein S deficiency on heparin IUP@ 27 4/7 weeks. BMZ complete abnl dopplers with intermittent decelerations . Reassuring BPP last night Hx gastric sleeve Abnl DSR. Declined amnio P) cont inpt. Continuous fetal monitoring. Hydrate. Reiterate need for  C/S if delivery needed BPP repeat per MFm recommendation

## 2018-01-17 NOTE — Progress Notes (Signed)
Pt up to bathroom without assistance 

## 2018-01-17 NOTE — Progress Notes (Signed)
Patient ID: Jean Garcia, female   DOB: 10-24-1975, 42 y.o.   MRN: [id number] FHT evaluated  150s minimal variability to intermittent 10x10, occasional accels, intermittent prolonged variable decels noted every 3-4 hrs. Continue to closely monitor

## 2018-01-17 NOTE — Progress Notes (Signed)
Prolonged deceleration down to 70 at nadir, resolved with repositioning to right side.

## 2018-01-17 NOTE — Progress Notes (Signed)
3 minute prolonged deceleration down to 50 at nadir, resolved with position change to left side.

## 2018-01-17 NOTE — Progress Notes (Signed)
HD #6 27 5/7 BMZ complete Chronic HTN   O:  Patient Vitals for the past 24 hrs:  BP Temp Temp src Pulse Resp SpO2 Weight  01/17/18 0552 (!) 155/86 98.7 F (37.1 C) Oral 63 16 96 % 84 kg  01/16/18 2334 (!) 151/81 98.8 F (37.1 C) Oral 64 16 96 % -  01/16/18 2003 128/82 98.8 F (37.1 C) Oral 75 20 94 % -  01/16/18 1500 124/76 98.7 F (37.1 C) Oral 70 18 96 % -  01/16/18 1312 130/77 98.8 F (37.1 C) Oral 67 18 96 % -  01/16/18 0837 (!) 149/81 - - 60 18 98 % -  lungs clear to A  Cor RRR Abdomen: gravid soft Ext no edema or calf tenderness Tracing: baseline 145 (+) variables lasting 20-25secs Rare ctx  [place] Mfm Fetal Bpp Wo Non Stress  Result Date: 01/17/2018 ----------------------------------------------------------------------  OBSTETRICS REPORT                        (Signed Final 01/17/2018 08:36 am) ---------------------------------------------------------------------- Patient Info  ID #:       [id number]                          D.O.B.:  February 01, 1976 (41 yrs)  Name:       Jean Garcia                 Visit Date: 01/16/2018 05:14 pm ---------------------------------------------------------------------- Performed By  Performed By:     Novella Rob        Ref. Address:      [place], [place]  [place]  Attending:        Melene Muller MD            Secondary Phy.:    3rd Nursing- HR                                                              OB                                                              3rd Floor  Referred By:      Alanda Slim             Location:          [facility]                    Davelyn Gwinn MD  ---------------------------------------------------------------------- Orders   #  Description                           Code        Ordered By   1  [place] MFM FETAL BPP WO NON               76819.01    Deepika Decatur      STRESS                                            Lysbeth Dicola   2  [place] MFM UA CORD DOPPLER                76820.02    Zadok Holaway  ----------------------------------------------------------------------   #  Order #                     Accession #                Episode #   1  [id number]                   [id number]                 [id number]   2  [id number]                   [id number]                 [id number]  ---------------------------------------------------------------------- Indications   [redacted] weeks gestation of pregnancy                Z3A.27   Hypertension - Chronic/Pre-existing            O10.019   (labetalol)   Advanced maternal age multigravida  35+,        O09.522   second trimester   Abnormal biochemical screen (quad) for         O28.9   Trisomy 21 (1:10)   Poor obstetric history: Previous gestational   O09.299   diabetes/pre eclampsia/ IUGR   Pregnancy complicated by previous gastric      O99.842   bypass, antepartum, second trimester   Obesity complicating pregnancy, second         O99.212   trimester (pregravid BMI 34)  ---------------------------------------------------------------------- Vital Signs  Weight (lb): 185                               Height:        5'1"  BMI:         34.95 ---------------------------------------------------------------------- Fetal Evaluation  Num Of Fetuses:          1  Fetal Heart Rate(bpm):   148  Cardiac Activity:        Observed  Presentation:            Cephalic  Placenta:                Anterior  Amniotic Fluid  AFI FV:      Within normal limits                              Largest Pocket(cm)                              4.07 ----------------------------------------------------------------------  Biophysical Evaluation  Amniotic F.V:   Within normal limits       F. Tone:         Observed  F. Movement:    Observed                   Score:           8/8  F. Breathing:   Observed ---------------------------------------------------------------------- OB History  Gravidity:    8         Term:   5        Prem:   0        SAB:   2  TOP:          0       Ectopic:  0        Living: 5 ---------------------------------------------------------------------- Gestational Age  LMP:           27w 5d        Date:  07/06/17                 EDD:   04/12/18  Best:          27w 5d     Det. By:  LMP  (07/06/17)          EDD:   04/12/18 ---------------------------------------------------------------------- Anatomy  Thoracic:              Appears normal         Abdomen:                Dilated bowel  Stomach:               Appears normal, left   Bladder:                Appears  normal                         sided ---------------------------------------------------------------------- Doppler - Fetal Vessels  Umbilical Artery                                                            ADFV    RDFV                                                              Yes     Yes ---------------------------------------------------------------------- Cervix Uterus Adnexa  Cervix  Not visualized (advanced GA >24wks) ---------------------------------------------------------------------- Impression  IUP at [redacted]w[redacted]d  Vertex  BPP = 8/8 ----------------------------------------------------------------------                      Melene Muller, MD Electronically Signed Final Report   01/17/2018 08:36 am ----------------------------------------------------------------------  [place] Mfm Ua Cord Doppler  Result Date: 01/17/2018 ----------------------------------------------------------------------  OBSTETRICS REPORT                        (Signed Final 01/17/2018 08:36 am) ---------------------------------------------------------------------- Patient Info  ID #:        [id number]                          D.O.B.:  September 01, 1975 (41 yrs)  Name:       Jean Garcia                 Visit Date: 01/16/2018 05:14 pm ---------------------------------------------------------------------- Performed By  Performed By:     Novella Rob        Ref. Address:      [address]  [place], [place]  Attending:        Melene Muller MD            Secondary Phy.:    3rd Nursing- HR                                                              OB                                                              3rd Floor  Referred By:      Alanda Slim             Location:          [facility]                    Melrose Kearse MD ---------------------------------------------------------------------- Orders   #  Description                           Code        Ordered By   1  [place] MFM FETAL BPP WO NON               76819.01    Fumie Fiallo      STRESS                                            Wilian Kwong   2  [place] MFM UA CORD DOPPLER                76820.02    Spero Gunnels  ----------------------------------------------------------------------   #  Order #                     Accession #                Episode #   1  [id number]                   [id number]                 [id number]   2  [id number]                   [id number]  [id number]  ---------------------------------------------------------------------- Indications   [redacted] weeks gestation of pregnancy                Z3A.27   Hypertension - Chronic/Pre-existing            O10.019   (labetalol)   Advanced maternal age multigravida 79+,         O71.522   second trimester   Abnormal biochemical screen (quad) for         O28.9   Trisomy 21 (1:10)   Poor obstetric history: Previous gestational   O09.299   diabetes/pre eclampsia/ IUGR   Pregnancy complicated by previous gastric      O99.842   bypass, antepartum, second trimester   Obesity complicating pregnancy, second         O99.212   trimester (pregravid BMI 34)  ---------------------------------------------------------------------- Vital Signs  Weight (lb): 185                               Height:        5'1"  BMI:         34.95 ---------------------------------------------------------------------- Fetal Evaluation  Num Of Fetuses:          1  Fetal Heart Rate(bpm):   148  Cardiac Activity:        Observed  Presentation:            Cephalic  Placenta:                Anterior  Amniotic Fluid  AFI FV:      Within normal limits                              Largest Pocket(cm)                              4.07 ---------------------------------------------------------------------- Biophysical Evaluation  Amniotic F.V:   Within normal limits       F. Tone:         Observed  F. Movement:    Observed                   Score:           8/8  F. Breathing:   Observed ---------------------------------------------------------------------- OB History  Gravidity:    8         Term:   5        Prem:   0        SAB:   2  TOP:          0       Ectopic:  0        Living: 5 ---------------------------------------------------------------------- Gestational Age  LMP:           27w 5d        Date:  07/06/17                 EDD:   04/12/18  Best:          27w 5d     Det. By:  LMP  (07/06/17)          EDD:   04/12/18 ---------------------------------------------------------------------- Anatomy  Thoracic:              Appears normal         Abdomen:  Dilated bowel  Stomach:               Appears normal, left   Bladder:                Appears normal                         sided  ---------------------------------------------------------------------- Doppler - Fetal Vessels  Umbilical Artery                                                            ADFV    RDFV                                                              Yes     Yes ---------------------------------------------------------------------- Cervix Uterus Adnexa  Cervix  Not visualized (advanced GA >24wks) ---------------------------------------------------------------------- Impression  IUP at [redacted]w[redacted]d  Vertex  BPP = 8/8 ----------------------------------------------------------------------                      Melene Muller, MD Electronically Signed Final Report   01/17/2018 08:36 am ---------------------------------------------------------------------- IMP: chronic HTN on labetalol and procardia abnl dopplers with reassuring BPP IUP @ 27 5/7 weeks BMZ complete Hx gastric sleeve Protein S deficiency on heparin Disc with pt our hope to by more time and get her to 28 wk Also reiterate the reason for C/S as mode of delivery once that decision is made P) cont monitoring. Cont inpt status. sched for repeat BPP, dopplers on Monday per MFM

## 2018-01-18 ENCOUNTER — Encounter (HOSPITAL_COMMUNITY): Payer: Self-pay | Admitting: Anesthesiology

## 2018-01-18 ENCOUNTER — Inpatient Hospital Stay (HOSPITAL_COMMUNITY): Payer: BLUE CROSS/BLUE SHIELD | Admitting: Anesthesiology

## 2018-01-18 ENCOUNTER — Encounter (HOSPITAL_COMMUNITY)
Admission: AD | Disposition: A | Discharge status: Home / Self Care | Payer: Self-pay | Source: Home / Self Care | Attending: Obstetrics and Gynecology

## 2018-01-18 LAB — COMPREHENSIVE METABOLIC PANEL
ALT: 68 U/L — ABNORMAL HIGH (ref 0–44)
ALT: 69 U/L — ABNORMAL HIGH (ref 0–44)
AST: 40 U/L (ref 15–41)
AST: 41 U/L (ref 15–41)
Albumin: 2.7 g/dL — ABNORMAL LOW (ref 3.5–5.0)
Albumin: 2.6 g/dL — ABNORMAL LOW (ref 3.5–5.0)
Alkaline Phosphatase: 61 U/L (ref 38–126)
Alkaline Phosphatase: 61 U/L (ref 38–126)
Anion gap: 11 (ref 5–15)
Anion gap: 10 (ref 5–15)
BUN: 8 mg/dL (ref 6–20)
BUN: 7 mg/dL (ref 6–20)
CO2: 21 mmol/L — ABNORMAL LOW (ref 22–32)
CO2: 22 mmol/L (ref 22–32)
Calcium: 8.8 mg/dL — ABNORMAL LOW (ref 8.9–10.3)
Calcium: 8.4 mg/dL — ABNORMAL LOW (ref 8.9–10.3)
Chloride: 103 mmol/L (ref 98–111)
Chloride: 102 mmol/L (ref 98–111)
Creatinine, Ser: 0.46 mg/dL (ref 0.44–1.00)
Creatinine, Ser: 0.43 mg/dL — ABNORMAL LOW (ref 0.44–1.00)
GFR calc Af Amer: >60 mL/min (ref >60)
GFR calc Af Amer: >60 mL/min (ref >60)
GFR calc non Af Amer: >60 mL/min (ref >60)
GFR calc non Af Amer: >60 mL/min (ref >60)
Glucose, Bld: 80 mg/dL (ref 70–99)
Glucose, Bld: 85 mg/dL (ref 70–99)
Potassium: 3.5 mmol/L (ref 3.5–5.1)
Potassium: 3.9 mmol/L (ref 3.5–5.1)
Sodium: 135 mmol/L (ref 135–145)
Sodium: 134 mmol/L — ABNORMAL LOW (ref 135–145)
Total Bilirubin: 0.6 mg/dL (ref 0.3–1.2)
Total Bilirubin: 0.4 mg/dL (ref 0.3–1.2)
Total Protein: 6.9 g/dL (ref 6.5–8.1)
Total Protein: 6.3 g/dL — ABNORMAL LOW (ref 6.5–8.1)

## 2018-01-18 LAB — CBC
HCT: 33.3 % — ABNORMAL LOW (ref 36.0–46.0)
HCT: 34.1 % — ABNORMAL LOW (ref 36.0–46.0)
Hemoglobin: 11.3 g/dL — ABNORMAL LOW (ref 12.0–15.0)
Hemoglobin: 11.3 g/dL — ABNORMAL LOW (ref 12.0–15.0)
MCH: 29.1 pg (ref 26.0–34.0)
MCH: 28.8 pg (ref 26.0–34.0)
MCHC: 33.9 g/dL (ref 30.0–36.0)
MCHC: 33.1 g/dL (ref 30.0–36.0)
MCV: 85.8 fL (ref 80.0–100.0)
MCV: 86.8 fL (ref 80.0–100.0)
Platelets: 281 10*3/uL (ref 150–400)
Platelets: 280 K/uL (ref 150–400)
RBC: 3.88 MIL/uL (ref 3.87–5.11)
RBC: 3.93 MIL/uL (ref 3.87–5.11)
RDW: 20.1 % — ABNORMAL HIGH (ref 11.5–15.5)
RDW: 20.2 % — ABNORMAL HIGH (ref 11.5–15.5)
WBC: 10.7 10*3/uL — ABNORMAL HIGH (ref 4.0–10.5)
WBC: 9.4 K/uL (ref 4.0–10.5)
nRBC: 0 % (ref 0.0–0.2)
nRBC: 0 % (ref 0.0–0.2)

## 2018-01-18 LAB — GROUP B STREP BY PCR: Group B strep by PCR: NEGATIVE

## 2018-01-18 LAB — URIC ACID
Uric Acid, Serum: 4.5 mg/dL (ref 2.5–7.1)
Uric Acid, Serum: 4.8 mg/dL (ref 2.5–7.1)

## 2018-01-18 LAB — PROTEIN / CREATININE RATIO, URINE
Creatinine, Urine: 33 mg/dL
Protein Creatinine Ratio: 0.18 mg/mg{Cre} — ABNORMAL HIGH (ref 0.00–0.15)
Total Protein, Urine: 6 mg/dL

## 2018-01-18 LAB — APTT
aPTT: 53 seconds — ABNORMAL HIGH (ref 24–36)
aPTT: 24 seconds (ref 24–36)

## 2018-01-18 LAB — PREPARE RBC (CROSSMATCH)

## 2018-01-18 SURGERY — Surgical Case
Anesthesia: Spinal | Site: Abdomen | Wound class: Clean Contaminated

## 2018-01-18 MED ORDER — MORPHINE SULFATE (PF) 0.5 MG/ML IJ SOLN
INTRAMUSCULAR | Status: AC
  Filled 2018-01-18: qty 10

## 2018-01-18 MED ORDER — SIMETHICONE 80 MG PO CHEW
80.0000 mg | CHEWABLE_TABLET | Freq: Three times a day (TID) | ORAL | Status: DC
  Administered 2018-01-19 – 2018-01-21 (×8): 80 mg via ORAL
  Filled 2018-01-18 (×8): qty 1

## 2018-01-18 MED ORDER — PHENYLEPHRINE 40 MCG/ML (10ML) SYRINGE FOR IV PUSH (FOR BLOOD PRESSURE SUPPORT)
PREFILLED_SYRINGE | INTRAVENOUS | Status: AC
  Filled 2018-01-18: qty 10

## 2018-01-18 MED ORDER — LACTATED RINGERS IV SOLN: INTRAVENOUS | Status: DC

## 2018-01-18 MED ORDER — METOCLOPRAMIDE HCL 5 MG/ML IJ SOLN
INTRAMUSCULAR | Status: AC
  Filled 2018-01-18: qty 2

## 2018-01-18 MED ORDER — NALBUPHINE HCL 10 MG/ML IJ SOLN: 5.0000 mg | Freq: Once | INTRAMUSCULAR | Status: DC | PRN

## 2018-01-18 MED ORDER — MENTHOL 3 MG MT LOZG: 1.0000 | LOZENGE | OROMUCOSAL | Status: DC | PRN

## 2018-01-18 MED ORDER — DIBUCAINE 1 % RE OINT: 1.0000 "application " | TOPICAL_OINTMENT | RECTAL | Status: DC | PRN

## 2018-01-18 MED ORDER — DIPHENHYDRAMINE HCL 25 MG PO CAPS: 25.0000 mg | ORAL_CAPSULE | Freq: Four times a day (QID) | ORAL | Status: DC | PRN

## 2018-01-18 MED ORDER — ONDANSETRON HCL 4 MG/2ML IJ SOLN: 4.0000 mg | Freq: Three times a day (TID) | INTRAMUSCULAR | Status: DC | PRN

## 2018-01-18 MED ORDER — FENTANYL CITRATE (PF) 100 MCG/2ML IJ SOLN
INTRAMUSCULAR | Status: AC
  Filled 2018-01-18: qty 2

## 2018-01-18 MED ORDER — BUPIVACAINE HCL (PF) 0.25 % IJ SOLN
INTRAMUSCULAR | Status: AC
  Filled 2018-01-18: qty 30

## 2018-01-18 MED ORDER — SIMETHICONE 80 MG PO CHEW
80.0000 mg | CHEWABLE_TABLET | ORAL | Status: DC
  Administered 2018-01-19 – 2018-01-21 (×3): 80 mg via ORAL
  Filled 2018-01-18 (×3): qty 1

## 2018-01-18 MED ORDER — OXYCODONE HCL 5 MG/5ML PO SOLN: 5.0000 mg | Freq: Once | ORAL | Status: DC | PRN

## 2018-01-18 MED ORDER — OXYTOCIN 40 UNITS IN LACTATED RINGERS INFUSION - SIMPLE MED: 2.5000 [IU]/h | INTRAVENOUS | Status: AC

## 2018-01-18 MED ORDER — BUPIVACAINE IN DEXTROSE 0.75-8.25 % IT SOLN
INTRATHECAL | Status: DC | PRN
  Administered 2018-01-18: 1.6 mL via INTRATHECAL

## 2018-01-18 MED ORDER — PHENYLEPHRINE 8 MG IN D5W 100 ML (0.08MG/ML) PREMIX OPTIME
INJECTION | INTRAVENOUS | Status: DC | PRN
  Administered 2018-01-18: 30 ug/min via INTRAVENOUS

## 2018-01-18 MED ORDER — SCOPOLAMINE 1 MG/3DAYS TD PT72: 1.0000 | MEDICATED_PATCH | Freq: Once | TRANSDERMAL | Status: DC

## 2018-01-18 MED ORDER — CEFAZOLIN SODIUM-DEXTROSE 2-3 GM-%(50ML) IV SOLR
INTRAVENOUS | Status: DC | PRN
  Administered 2018-01-18: 2 g via INTRAVENOUS

## 2018-01-18 MED ORDER — HYDROMORPHONE HCL 1 MG/ML IJ SOLN: 0.2500 mg | INTRAMUSCULAR | Status: DC | PRN

## 2018-01-18 MED ORDER — SCOPOLAMINE 1 MG/3DAYS TD PT72
MEDICATED_PATCH | TRANSDERMAL | Status: AC
  Filled 2018-01-18: qty 1

## 2018-01-18 MED ORDER — SODIUM CHLORIDE 0.9% IV SOLUTION: Freq: Once | INTRAVENOUS | Status: DC

## 2018-01-18 MED ORDER — ONDANSETRON HCL 4 MG/2ML IJ SOLN
INTRAMUSCULAR | Status: DC | PRN
  Administered 2018-01-18: 4 mg via INTRAVENOUS

## 2018-01-18 MED ORDER — PHENYLEPHRINE HCL 10 MG/ML IJ SOLN
INTRAMUSCULAR | Status: DC | PRN
  Administered 2018-01-18: 40 ug via INTRAVENOUS

## 2018-01-18 MED ORDER — LACTATED RINGERS IV SOLN
INTRAVENOUS | Status: DC
  Administered 2018-01-18 – 2018-01-19 (×2): via INTRAVENOUS

## 2018-01-18 MED ORDER — ENOXAPARIN SODIUM 40 MG/0.4ML ~~LOC~~ SOLN
40.0000 mg | SUBCUTANEOUS | Status: DC
  Administered 2018-01-19 – 2018-01-20 (×2): 40 mg via SUBCUTANEOUS
  Filled 2018-01-18 (×2): qty 0.4

## 2018-01-18 MED ORDER — PROTAMINE SULFATE 10 MG/ML IV SOLN
50.0000 mg | Freq: Once | INTRAVENOUS | Status: AC
  Administered 2018-01-18: 50 mg via INTRAVENOUS
  Filled 2018-01-18: qty 5

## 2018-01-18 MED ORDER — SCOPOLAMINE 1 MG/3DAYS TD PT72
MEDICATED_PATCH | TRANSDERMAL | Status: DC | PRN
  Administered 2018-01-18: 1 via TRANSDERMAL

## 2018-01-18 MED ORDER — PRENATAL MULTIVITAMIN CH
1.0000 | ORAL_TABLET | Freq: Every day | ORAL | Status: DC
  Administered 2018-01-19 – 2018-01-21 (×3): 1 via ORAL
  Filled 2018-01-18 (×2): qty 1

## 2018-01-18 MED ORDER — SIMETHICONE 80 MG PO CHEW: 80.0000 mg | CHEWABLE_TABLET | ORAL | Status: DC | PRN

## 2018-01-18 MED ORDER — NALOXONE HCL 0.4 MG/ML IJ SOLN: 0.4000 mg | INTRAMUSCULAR | Status: DC | PRN

## 2018-01-18 MED ORDER — COCONUT OIL OIL
1.0000 "application " | TOPICAL_OIL | Status: DC | PRN
  Administered 2018-01-21: 1 via TOPICAL
  Filled 2018-01-18: qty 120

## 2018-01-18 MED ORDER — DIPHENHYDRAMINE HCL 25 MG PO CAPS
25.0000 mg | ORAL_CAPSULE | ORAL | Status: DC | PRN
  Filled 2018-01-18: qty 1

## 2018-01-18 MED ORDER — WITCH HAZEL-GLYCERIN EX PADS: 1.0000 "application " | MEDICATED_PAD | CUTANEOUS | Status: DC | PRN

## 2018-01-18 MED ORDER — PROMETHAZINE HCL 25 MG/ML IJ SOLN: 6.2500 mg | INTRAMUSCULAR | Status: DC | PRN

## 2018-01-18 MED ORDER — SOD CITRATE-CITRIC ACID 500-334 MG/5ML PO SOLN
ORAL | Status: AC
  Administered 2018-01-18: 30 mL
  Filled 2018-01-18: qty 15

## 2018-01-18 MED ORDER — OXYTOCIN 10 UNIT/ML IJ SOLN
INTRAVENOUS | Status: DC | PRN
  Administered 2018-01-18: 40 [IU] via INTRAVENOUS

## 2018-01-18 MED ORDER — SENNOSIDES-DOCUSATE SODIUM 8.6-50 MG PO TABS
2.0000 | ORAL_TABLET | ORAL | Status: DC
  Administered 2018-01-19 – 2018-01-21 (×3): 2 via ORAL
  Filled 2018-01-18 (×3): qty 2

## 2018-01-18 MED ORDER — SODIUM CHLORIDE 0.9% FLUSH: 3.0000 mL | INTRAVENOUS | Status: DC | PRN

## 2018-01-18 MED ORDER — OXYCODONE-ACETAMINOPHEN 5-325 MG PO TABS
2.0000 | ORAL_TABLET | ORAL | Status: DC | PRN
  Administered 2018-01-19 – 2018-01-21 (×4): 2 via ORAL
  Filled 2018-01-18 (×4): qty 2

## 2018-01-18 MED ORDER — ONDANSETRON HCL 4 MG/2ML IJ SOLN
INTRAMUSCULAR | Status: AC
  Filled 2018-01-18: qty 2

## 2018-01-18 MED ORDER — TETANUS-DIPHTH-ACELL PERTUSSIS 5-2.5-18.5 LF-MCG/0.5 IM SUSP
0.5000 mL | Freq: Once | INTRAMUSCULAR | Status: AC
  Administered 2018-01-19: 0.5 mL via INTRAMUSCULAR

## 2018-01-18 MED ORDER — LACTATED RINGERS IV SOLN
INTRAVENOUS | Status: DC | PRN
  Administered 2018-01-18: 14:00:00 via INTRAVENOUS

## 2018-01-18 MED ORDER — OXYCODONE-ACETAMINOPHEN 5-325 MG PO TABS
1.0000 | ORAL_TABLET | ORAL | Status: DC | PRN
  Administered 2018-01-19: 1 via ORAL
  Filled 2018-01-18: qty 1

## 2018-01-18 MED ORDER — METOCLOPRAMIDE HCL 5 MG/ML IJ SOLN
INTRAMUSCULAR | Status: DC | PRN
  Administered 2018-01-18: 10 mg via INTRAVENOUS

## 2018-01-18 MED ORDER — PHENYLEPHRINE 8 MG IN D5W 100 ML (0.08MG/ML) PREMIX OPTIME
INJECTION | INTRAVENOUS | Status: AC
  Filled 2018-01-18: qty 100

## 2018-01-18 MED ORDER — MAGNESIUM SULFATE 40 G IN LACTATED RINGERS - SIMPLE
2.0000 g/h | INTRAVENOUS | Status: DC
  Administered 2018-01-18: 2 g/h via INTRAVENOUS
  Filled 2018-01-18: qty 500

## 2018-01-18 MED ORDER — DEXAMETHASONE SODIUM PHOSPHATE 10 MG/ML IJ SOLN
INTRAMUSCULAR | Status: AC
  Filled 2018-01-18: qty 1

## 2018-01-18 MED ORDER — NALBUPHINE HCL 10 MG/ML IJ SOLN: 5.0000 mg | INTRAMUSCULAR | Status: DC | PRN

## 2018-01-18 MED ORDER — FENTANYL CITRATE (PF) 100 MCG/2ML IJ SOLN
INTRAMUSCULAR | Status: DC | PRN
  Administered 2018-01-18: 15 ug via INTRATHECAL

## 2018-01-18 MED ORDER — MORPHINE SULFATE (PF) 0.5 MG/ML IJ SOLN
INTRAMUSCULAR | Status: DC | PRN
  Administered 2018-01-18: .15 mg via INTRATHECAL

## 2018-01-18 MED ORDER — MEPERIDINE HCL 25 MG/ML IJ SOLN: 6.2500 mg | INTRAMUSCULAR | Status: DC | PRN

## 2018-01-18 MED ORDER — MAGNESIUM SULFATE BOLUS VIA INFUSION
4.0000 g | Freq: Once | INTRAVENOUS | Status: AC
  Administered 2018-01-18: 4 g via INTRAVENOUS
  Filled 2018-01-18: qty 500

## 2018-01-18 MED ORDER — DIPHENHYDRAMINE HCL 50 MG/ML IJ SOLN: 12.5000 mg | INTRAMUSCULAR | Status: DC | PRN

## 2018-01-18 MED ORDER — MAGNESIUM SULFATE 40 G IN LACTATED RINGERS - SIMPLE
2.0000 g/h | INTRAVENOUS | Status: AC
  Administered 2018-01-19: 2 g/h via INTRAVENOUS
  Filled 2018-01-18 (×2): qty 500

## 2018-01-18 MED ORDER — BUPIVACAINE HCL (PF) 0.25 % IJ SOLN
INTRAMUSCULAR | Status: DC | PRN
  Administered 2018-01-18: 8 mL

## 2018-01-18 MED ORDER — DEXAMETHASONE SODIUM PHOSPHATE 4 MG/ML IJ SOLN
INTRAMUSCULAR | Status: DC | PRN
  Administered 2018-01-18: 4 mg via INTRAVENOUS

## 2018-01-18 MED ORDER — ZOLPIDEM TARTRATE 5 MG PO TABS: 5.0000 mg | ORAL_TABLET | Freq: Every evening | ORAL | Status: DC | PRN

## 2018-01-18 MED ORDER — OXYCODONE HCL 5 MG PO TABS: 5.0000 mg | ORAL_TABLET | Freq: Once | ORAL | Status: DC | PRN

## 2018-01-18 MED ORDER — OXYTOCIN 10 UNIT/ML IJ SOLN
INTRAMUSCULAR | Status: AC
  Filled 2018-01-18: qty 4

## 2018-01-18 MED ORDER — NALOXONE HCL 4 MG/10ML IJ SOLN
1.0000 ug/kg/h | INTRAVENOUS | Status: DC | PRN
  Filled 2018-01-18: qty 5

## 2018-01-18 MED ORDER — CEFAZOLIN SODIUM-DEXTROSE 2-4 GM/100ML-% IV SOLN
INTRAVENOUS | Status: AC
  Filled 2018-01-18: qty 100

## 2018-01-18 SURGICAL SUPPLY — 44 items
BARRIER ADHS 3X4 INTERCEED (GAUZE/BANDAGES/DRESSINGS) ×2 IMPLANT
BENZOIN TINCTURE PRP APPL 2/3 (GAUZE/BANDAGES/DRESSINGS) ×2 IMPLANT
CHLORAPREP W/TINT 26ML (MISCELLANEOUS) ×2 IMPLANT
CLAMP CORD UMBIL (MISCELLANEOUS) IMPLANT
CLOTH BEACON ORANGE TIMEOUT ST (SAFETY) ×2 IMPLANT
DRAPE C SECTION CLR SCREEN (DRAPES) ×2 IMPLANT
DRESSING PREVENA PLUS CUSTOM (GAUZE/BANDAGES/DRESSINGS) ×1 IMPLANT
DRSG OPSITE POSTOP 4X10 (GAUZE/BANDAGES/DRESSINGS) ×2 IMPLANT
DRSG PREVENA PLUS CUSTOM (GAUZE/BANDAGES/DRESSINGS) ×2
ELECT REM PT RETURN 9FT ADLT (ELECTROSURGICAL) ×2
ELECTRODE REM PT RTRN 9FT ADLT (ELECTROSURGICAL) ×1 IMPLANT
EXTRACTOR VACUUM M CUP 4 TUBE (SUCTIONS) IMPLANT
GLOVE BIOGEL PI IND STRL 7.0 (GLOVE) ×2 IMPLANT
GLOVE BIOGEL PI INDICATOR 7.0 (GLOVE) ×2
GLOVE ECLIPSE 6.5 STRL STRAW (GLOVE) ×2 IMPLANT
GOWN STRL REUS W/TWL LRG LVL3 (GOWN DISPOSABLE) ×4 IMPLANT
KIT ABG SYR 3ML LUER SLIP (SYRINGE) IMPLANT
KIT PREVENA INCISION MGT20CM45 (CANNISTER) ×2 IMPLANT
NEEDLE HYPO 22GX1.5 SAFETY (NEEDLE) ×2 IMPLANT
NEEDLE HYPO 25X5/8 SAFETYGLIDE (NEEDLE) IMPLANT
NS IRRIG 1000ML POUR BTL (IV SOLUTION) ×2 IMPLANT
PACK C SECTION WH (CUSTOM PROCEDURE TRAY) ×2 IMPLANT
PAD OB MATERNITY 4.3X12.25 (PERSONAL CARE ITEMS) ×2 IMPLANT
RTRCTR C-SECT PINK 25CM LRG (MISCELLANEOUS) IMPLANT
SPONGE LAP 18X18 RF (DISPOSABLE) ×6 IMPLANT
STRIP CLOSURE SKIN 1/2X4 (GAUZE/BANDAGES/DRESSINGS) ×2 IMPLANT
SUT CHROMIC GUT AB #0 18 (SUTURE) IMPLANT
SUT MNCRL 0 VIOLET CTX 36 (SUTURE) ×3 IMPLANT
SUT MON AB 2-0 SH 27 (SUTURE)
SUT MON AB 2-0 SH27 (SUTURE) IMPLANT
SUT MON AB 3-0 SH 27 (SUTURE)
SUT MON AB 3-0 SH27 (SUTURE) IMPLANT
SUT MON AB 4-0 PS1 27 (SUTURE) IMPLANT
SUT MONOCRYL 0 CTX 36 (SUTURE) ×3
SUT PLAIN 2 0 (SUTURE)
SUT PLAIN 2 0 XLH (SUTURE) IMPLANT
SUT PLAIN ABS 2-0 CT1 27XMFL (SUTURE) IMPLANT
SUT VIC AB 0 CT1 36 (SUTURE) ×4 IMPLANT
SUT VIC AB 2-0 CT1 27 (SUTURE) ×1
SUT VIC AB 2-0 CT1 TAPERPNT 27 (SUTURE) ×1 IMPLANT
SUT VIC AB 4-0 PS2 27 (SUTURE) IMPLANT
SYR CONTROL 10ML LL (SYRINGE) ×2 IMPLANT
TOWEL OR 17X24 6PK STRL BLUE (TOWEL DISPOSABLE) ×2 IMPLANT
TRAY FOLEY W/BAG SLVR 14FR LF (SET/KITS/TRAYS/PACK) IMPLANT

## 2018-01-18 NOTE — Progress Notes (Deleted)
Late decel resolved with maternal repositioning to right side. Dr. Garwin Brothers paged.

## 2018-01-18 NOTE — Progress Notes (Addendum)
C/S on hold while awaiting pending labs.  Labs Not drawn until 11:40 am [facility] available CBC stable CMP, uric acid and APTT still pending BP 125/83 (BP Location: Right Arm)   Pulse 71   Temp 99 F (37.2 C) (Oral)   Resp 18   Ht 5\' 1"  (1.549 m)   Wt 82.6 kg   LMP 07/06/2017   SpO2 97%   BMI 34.39 kg/m  Tracing reviewed> baseline 145 decreased variability. (+) decels ( variables) some irreg ctx with decel post ctx as well On magnesium GBS cx pending   Addendum; PTT 24 CMP Latest Ref Rng & Units 01/18/2018 01/18/2018 01/12/2018  Glucose 70 - 99 mg/dL 85 80 83  BUN 6 - 20 mg/dL 7 8 9   Creatinine 0.44 - 1.00 mg/dL 0.43(L) 0.46 0.52  Sodium 135 - 145 mmol/L 134(L) 135 135  Potassium 3.5 - 5.1 mmol/L 3.9 3.5 3.1(L)  Chloride 98 - 111 mmol/L 102 103 104  CO2 22 - 32 mmol/L 22 21(L) 21(L)  Calcium 8.9 - 10.3 mg/dL 8.4(L) 8.8(L) 8.4(L)  Total Protein 6.5 - 8.1 g/dL 6.3(L) 6.9 6.9  Total Bilirubin 0.3 - 1.2 mg/dL 0.4 0.6 0.4  Alkaline Phos 38 - 126 U/L 61 61 62  AST 15 - 41 U/L 41 40 29  ALT 0 - 44 U/L 69(H) 68(H) 41  another C/S in progress Will follow that case. OR notified

## 2018-01-18 NOTE — Progress Notes (Addendum)
HD # 8 28 weeks  BMZ complete  abnl dopplers  S; denies h/a , visual changes or epigastric pain Requesting permanent sterilization  O: BP (!) 155/100 (BP Location: Right Arm)   Pulse 70   Temp 99 F (37.2 C) (Oral)   Resp 16   Ht 5\' 1"  (1.549 m)   Wt 82.6 kg   LMP 07/06/2017   SpO2 98%   BMI 34.39 kg/m  Patient Vitals for the past 24 hrs:  BP Temp Temp src Pulse Resp SpO2 Weight  01/18/18 0922 (!) 155/100 - - 70 - 98 % -  01/18/18 0917 (!) 155/101 - - 73 - 99 % -  01/18/18 0902 (!) 164/102 - - 67 - 100 % -  01/18/18 0847 (!) 155/97 - - 73 - 98 % -  01/18/18 0832 (!) 155/99 - - 74 - 98 % -  01/18/18 0817 (!) 159/89 - - 75 - 98 % -  01/18/18 0800 (!) 154/99 - - 75 16 98 % -  01/18/18 0756 (!) 159/92 - - - - - -  01/18/18 0513 - - - - - - 82.6 kg  01/18/18 0510 (!) 144/88 99 F (37.2 C) Oral 65 18 98 % -  01/17/18 1951 133/78 99.3 F (37.4 C) Oral 74 17 97 % -  01/17/18 1605 124/70 98.9 F (37.2 C) Oral 75 18 97 % -  lungs clear to A Cor RRR Abdomen: gravid non tender Pelvic deferred extr no edema  Tracing; baseline 145 (+)_decel x 4 mins  Down to 60-70  With return to baseline Subsequent spontaneous decels noted No ctx  CBC Latest Ref Rng & Units 01/18/2018 01/12/2018 12/07/2017  WBC 4.0 - 10.5 K/uL 10.7(H) 8.4 10.1  Hemoglobin 12.0 - 15.0 g/dL 11.3(L) 10.7(L) 9.9(L)  Hematocrit 36.0 - 46.0 % 33.3(L) 31.8(L) 30.1(L)  Platelets 150 - 400 K/uL 281 272 327   CMP Latest Ref Rng & Units 01/18/2018 01/12/2018 12/09/2017  Glucose 70 - 99 mg/dL 80 83 77  BUN 6 - 20 mg/dL 8 9 <5(L)  Creatinine 0.44 - 1.00 mg/dL 0.46 0.52 0.35(L)  Sodium 135 - 145 mmol/L 135 135 138  Potassium 3.5 - 5.1 mmol/L 3.5 3.1(L) 3.9  Chloride 98 - 111 mmol/L 103 104 108  CO2 22 - 32 mmol/L 21(L) 21(L) 20(L)  Calcium 8.9 - 10.3 mg/dL 8.8(L) 8.4(L) 8.4(L)  Total Protein 6.5 - 8.1 g/dL 6.9 6.9 -  Total Bilirubin 0.3 - 1.2 mg/dL 0.6 0.4 -  Alkaline Phos 38 - 126 U/L 61 62 -  AST 15 - 41 U/L 40  29 -  ALT 0 - 44 U/L 68(H) 41 -  IMP: chronic HTN with early superimposed preeclampsia abnl fetal tracing with abnl dopplers Protein S deficiency on heparin last dose 10;08 pm Hx gastric sleeve BMZ complete Desires permanent sterilization  Given the above tracing as well as abnl ALT in a setting of prior hx severe preeclampsia . Will proceed with delivery P) APTT done ( elevated). Will do protamine. Repeat level @ 11 am. Started magnesium for neuro prophylaxis while awaiting delivery. Anticipate continue post delivery x 24 hrs. Repeat PIH labs at 11 am. NPO. Cont with BP meds( sip  Of water). reviewed issues with pt and husband. OR , NICU notified. Reviewed risk of surgery including infection, bleeding, poss need for blood transfusion and its risk( HIV, acute rxn, hepatitis). Injury to bladder , bowel, ureters. Internal scar tissue, disc permanent sterilization, failure rate 1/300, nonreversible. Also  disc this a premature baby, do they still want to proceed with permanence and both pt and husband said they are done with having any more. Disc husband can have vasectomy. He plans to do that as well. Will do GBS PCR  Addendum: Called blood bank to have 2 units PRBC available BP (!) 152/92 (BP Location: Right Arm)   Pulse 79   Temp 99 F (37.2 C) (Oral)   Resp 16   Ht 5\' 1"  (1.549 m)   Wt 82.6 kg   LMP 07/06/2017   SpO2 95%   BMI 34.39 kg/m

## 2018-01-18 NOTE — Anesthesia Procedure Notes (Signed)
Spinal  Patient location during procedure: OB Start time: 01/18/2018 2:01 PM End time: 01/18/2018 2:06 PM Staffing Anesthesiologist: Lynda Rainwater, MD Performed: anesthesiologist  Preanesthetic Checklist Completed: patient identified, surgical consent, pre-op evaluation, timeout performed, IV checked, risks and benefits discussed and monitors and equipment checked Spinal Block Patient position: sitting Prep: site prepped and draped and DuraPrep Patient monitoring: heart rate, cardiac monitor, continuous pulse ox and blood pressure Approach: midline Location: L3-4 Injection technique: single-shot Needle Needle type: Pencan  Needle gauge: 24 G Needle length: 10 cm Assessment Sensory level: T4

## 2018-01-18 NOTE — Lactation Note (Signed)
This note was copied from a baby's chart. Lactation Consultation Note  Patient Name: Jean Garcia [id number] Date: 01/18/2018 Reason for consult: Initial assessment;NICU baby;Preterm <34wks  P6 mother whose infant is now 17 hours old.  This is a 28+0 week baby weighing 1 +14.7 lbs and in the NICU.  Mother has a little bit of breast feeding experience but her youngest child is now 42 years old.  She has only breast fed a couple of the children for a few months only.  Offered to initiate the DEBP for her and she accepted.  Pump parts, assembly, disassembly and cleaning reviewed with mother.  Mother's breasts are soft and non tender and nipples are short shafted.  Mother will pump every 3 hours while awake and at least once during the night. Hand expression taught with return demonstration.  Colostrum container provided for any EBM she may obtain with pumping or hand expression.  Reviewed milk storage times and provided the NICU booklet for reference.  Mother will take all drops to NICU.  I also informed her that she can pump at baby's bedside in NICU using screens or  pump in the private pumping rooms.  She will leave her pump parts at baby's bedside when she is discharged.  Mother has a DEBP for home use.  Encouraged her to call for any questions/concerns she may have.  Allowed time for verbalization of feelings related to NICU admission.  Visitors present.  RN came in room and updated.   Maternal Data Formula Feeding for Exclusion: No Has patient been taught Hand Expression?: Yes Does the patient have breastfeeding experience prior to this delivery?: Yes  Feeding    LATCH Score                   Interventions    Lactation Tools Discussed/Used Pump Review: Setup, frequency, and cleaning;Milk Storage Initiated by:: Paul Dykes Date initiated:: 01/18/18   Consult Status Consult Status: Follow-up Date: 01/19/18 Follow-up type: In-patient    Mayrene Bastarache R  Elenie Coven 01/18/2018, 7:08 PM

## 2018-01-18 NOTE — Anesthesia Postprocedure Evaluation (Signed)
Anesthesia Post Note  Patient: Jean Garcia  Procedure(s) Performed: PRIMARY CLASSICAL CESAREAN SECTION AND MODIFIED POMEROY BILATERAL TUBAL LIGATION (N/A Abdomen)     Patient location during evaluation: PACU Anesthesia Type: Spinal Level of consciousness: oriented and awake and alert Pain management: pain level controlled Vital Signs Assessment: post-procedure vital signs reviewed and stable Respiratory status: spontaneous breathing and respiratory function stable Cardiovascular status: blood pressure returned to baseline and stable Postop Assessment: no headache, no backache and no apparent nausea or vomiting Anesthetic complications: no    Last Vitals:  Vitals:   01/18/18 1645 01/18/18 1723  BP: 131/86 129/88  Pulse: 61 71  Resp: 13 15  Temp: 36.8 C 36.8 C  SpO2: 98% 97%    Last Pain:  Vitals:   01/18/18 1723  TempSrc: Oral  PainSc:    Pain Goal: Patients Stated Pain Goal: 3 (01/14/18 0750)               Lynda Rainwater

## 2018-01-18 NOTE — Progress Notes (Signed)
Prolonged decel resolved with maternal repositioning to right side.

## 2018-01-18 NOTE — Brief Op Note (Signed)
01/18/2018  3:42 PM  PATIENT:  Jean Garcia  42 y.o. female  PRE-OPERATIVE DIAGNOSIS:  Protein S deficiency , chronic hypertension with superimposed preeclampsia,  Abnormal fetal testing/tracing, IUP@ 28 weeks, desires sterilization, hx gastric sleeve  POST-OPERATIVE DIAGNOSIS:  Protein S deficiency, chronic hypertension with superimposed preeclampsia, hx gastric sleeve, IUP@ 28 weeks, abnormal fetal testing/tracing, desires sterilization  PROCEDURE:  Primary Cesarean section, Classical hysterotomy, Modified [place] Tubal ligation  SURGEON:  Surgeon(s) and Role:    * Servando Salina, MD - Primary    * Azucena Fallen, MD - Assisting  PHYSICIAN ASSISTANT:   ASSISTANTS: Azucena Fallen, MD   ANESTHESIA:   spinal Findings: live female 870 gram, right hand compound presentation, CAN x 1 reduced, short cord. Ant placenta, nl ovaries , nl tubes but diffuse posterior thin adhesion on post uterine serosa extend to bowels on left, appendiceal tip on right and peri-tubal and peri-ovarian bilaterally . All lysed. Cord ph pending. Apgar 8/8. Wt 1lb 14 oz EBL:  180 mL   BLOOD ADMINISTERED:none  DRAINS: none   LOCAL MEDICATIONS USED:  MARCAINE     SPECIMEN:  Source of Specimen:  portion of right and left tube, placenta  DISPOSITION OF SPECIMEN:  PATHOLOGY  COUNTS:  YES  TOURNIQUET:  * No tourniquets in log *  DICTATION: .Other Dictation: Dictation Number [id number]  PLAN OF CARE: Admit to inpatient   PATIENT DISPOSITION:  PACU - hemodynamically stable.   Delay start of Pharmacological VTE agent (>24hrs) due to surgical blood loss or risk of bleeding: no

## 2018-01-18 NOTE — Transfer of Care (Signed)
Immediate Anesthesia Transfer of Care Note  Patient: Jean Garcia  Procedure(s) Performed: PRIMARY CLASSICAL CESAREAN SECTION AND MODIFIED POMEROY BILATERAL TUBAL LIGATION (N/A Abdomen)  Patient Location: PACU  Anesthesia Type:Spinal  Level of Consciousness: awake, alert  and oriented  Airway & Oxygen Therapy: Patient Spontanous Breathing  Post-op Assessment: Report given to RN and Post -op Vital signs reviewed and stable  Post vital signs: Reviewed and stable  Last Vitals:  Vitals Value Taken Time  BP 127/106 01/18/2018  3:47 PM  Temp 36.6 C 01/18/2018  3:47 PM  Pulse 63 01/18/2018  3:49 PM  Resp 16 01/18/2018  3:49 PM  SpO2 96 % 01/18/2018  3:49 PM  Vitals shown include unvalidated device data.  Last Pain:  Vitals:   01/18/18 1547  TempSrc: Oral  PainSc:       Patients Stated Pain Goal: 3 (25/95/63 8756)  Complications: No apparent anesthesia complications

## 2018-01-18 NOTE — Anesthesia Preprocedure Evaluation (Signed)
Anesthesia Evaluation  Patient identified by MRN, date of birth, ID band Patient awake    Reviewed: Allergy & Precautions, NPO status , Patient's Chart, lab work & pertinent test results  Airway Mallampati: II  TM Distance: >3 FB Neck ROM: Full    Dental no notable dental hx.    Pulmonary neg pulmonary ROS,    Pulmonary exam normal breath sounds clear to auscultation       Cardiovascular hypertension, negative cardio ROS Normal cardiovascular exam Rhythm:Regular Rate:Normal     Neuro/Psych Anxiety Depression negative neurological ROS  negative psych ROS   GI/Hepatic negative GI ROS, Neg liver ROS,   Endo/Other  negative endocrine ROS  Renal/GU negative Renal ROS  negative genitourinary   Musculoskeletal negative musculoskeletal ROS (+)   Abdominal   Peds negative pediatric ROS (+)  Hematology negative hematology ROS (+)   Anesthesia Other Findings   Reproductive/Obstetrics (+) Pregnancy                             Anesthesia Physical Anesthesia Plan  ASA: II  Anesthesia Plan: Spinal   Post-op Pain Management:    Induction:   PONV Risk Score and Plan: 2 and Treatment may vary due to age or medical condition  Airway Management Planned: Natural Airway  Additional Equipment:   Intra-op Plan:   Post-operative Plan:   Informed Consent: I have reviewed the patients History and Physical, chart, labs and discussed the procedure including the risks, benefits and alternatives for the proposed anesthesia with the patient or authorized representative who has indicated his/her understanding and acceptance.   Dental advisory given  Plan Discussed with: CRNA  Anesthesia Plan Comments:         Anesthesia Quick Evaluation

## 2018-01-18 NOTE — Op Note (Signed)
NAMECHIRSTINA, HAAN MEDICAL RECORD [id number] ACCOUNT [id number] DATE OF BIRTH:1976-03-23 FACILITY: [facility] LOCATION: [id number] PHYSICIAN:Javel Hersh A. Jeramy Dimmick, MD  OPERATIVE REPORT  DATE OF PROCEDURE:  01/18/2018  PREOPERATIVE DIAGNOSES:   1.  Abnormal fetal tracing/testing. 2.  Chronic hypertension with superimposed preeclampsia. 3.  Protein S deficiency.   4.  Intrauterine gestation at 28 weeks.   5.  History of gastric sleeve.   6.  Desires sterilization.  POSTOPERATIVE DIAGNOSES: 1.  Abnormal fetal tracing/testing. 2.  Chronic hypertension with superimposed preeclampsia. 3.  Protein S deficiency.   4.  Intrauterine gestation at 28 weeks.   5.  History of gastric sleeve.   6.  Desires sterilization.  PROCEDURES:   1.  Primary cesarean section, Classical hysterotomy.   2.  Modified Pomeroy tubal ligation.  ANESTHESIA:  Spinal.  SURGEON:  Servando Salina, MD  ASSISTANT:  Maren Reamer, M.D.  DESCRIPTION OF PROCEDURE:  Under adequate spinal anesthesia, the patient was placed in the supine position with a left lateral tilt.  She was sterilely prepped and draped in the usual fashion.  An indwelling Foley catheter was sterilely placed.  Marcaine  0.25% was injected along the planned Pfannenstiel skin incision site.  Pfannenstiel skin incision was then made, carried down to the rectus fascia.  The rectus fascia was opened transversely.  The rectus fascia was then bluntly and sharply dissected off the rectus muscle in a superior and inferior fashion.  The rectus muscle split in the midline.  The parietal peritoneum was entered sharply and extended.  A self-retaining Alexis retractor was then placed.  Vesicouterine peritoneum was opened  transversely.  The bladder was gently dissected off the lower uterine segment and displaced inferiorly.  A  developed lower uterine segment bone marrow was noted, however, a prominent blood vessel was noted on the right, resulting in a  decision to make a low vertical incision, which resulted in artificial rupture of membranes, clear fluid.  The incision needed to be extended to deliver the baby after carefully after initial attempt with the lower incision unsuccessful.  Subsequent delivery of a  live female with a right hand compound presentation, nuchal cord x1.  Cord was clamped and cut.  The baby was transferred to the awaiting pediatricians with Apgar scores pending as baby was quickly transferred to the Intensive Care due to prematurity.  The placenta was spontaneous intact with a short cord noted and sent to pathology.  Uterine cavity was cleaned of debris.  Uterine incision was then closed in multiple layers using 0 Monocryl running lock stitch, interrupted 0 Monocryl suture and the serosal surface was closed with 3-0 Monocryl in a baseball fashion.  Additional bleeding site at the inferior aspect of the incision resulted in additional sutures being placed with good hemostasis subsequently noted.  The uterus was  exteriorized in the process of trying to repair the hysterotomy and in doing so, filmy adhesions were noted on the posterior aspect of the uterus extending down to the posterior cul-de-sac and to the bowels on the left as well as the appendix on the right which ultimately appeared to be retroperitoneal and otherwise normal.  Peritubal and periovarian adhesions were noted bilaterally as well.  All of these were carefully lysed and/or removed off of the serosal surface of the uterus.  The abdomen was  then irrigated and suctioned of debris.  The uterus was then returned to the abdomen.  The right fallopian tube was grasped in the midportion using a Babcock.  The  underlying mesosalpinx was opened.  The proximal and distal portion of the tube was tied with 0 chromic suture x2 and the intervening segment of tube was then removed.  Bleeding vessel  posterior to that in the mesosalpinx was clamped, cut, and free tied with a 2-0 Monocryl  suture.  The same procedure was performed on the left fallopian tube  on the contralateral side after again removing any peritubal adhesions.  The midportion of the tube was also removed and the proximal and distal segment was cauterized.  Small bleeding were cauterized.  The Interceed was placed in the lower uterine segment in inverted T fashion.  The Alexis retractor was then removed.  The parietal peritoneum was then closed with 2-0 Vicryl.  The rectus fascia was closed with 0 Vicryl x2.  The subcutaneous area was irrigated, small bleeders cauterized.  Interrupted  2-0 plain sutures placed and the skin approximated using 4-0 Vicryl subcuticular closure.  A PICO dressing was placed on the incision.  SPECIMEN:  Placenta, portion of right and left fallopian tube also sent to pathology.  ESTIMATED BLOOD LOSS:  190 mL.  INTRAOPERATIVE FLUIDS:  1600 mL  URINE OUTPUT:  150 mL.  COUNTS:  Sponge and instrument counts x2 was correct.  COMPLICATIONS:  None.  The patient tolerated the procedure well and was transferred to recovery room in stable condition.  The baby was transferred to the NICU.  AN/NUANCE  D:01/18/2018 T:01/18/2018 JOB:[id number]

## 2018-01-18 NOTE — Progress Notes (Signed)
To O.R. via bed

## 2018-01-19 ENCOUNTER — Encounter (HOSPITAL_COMMUNITY): Payer: Self-pay | Admitting: Obstetrics and Gynecology

## 2018-01-19 LAB — COMPREHENSIVE METABOLIC PANEL
ALT: 69 U/L — ABNORMAL HIGH (ref 0–44)
AST: 56 U/L — ABNORMAL HIGH (ref 15–41)
Albumin: 2.9 g/dL — ABNORMAL LOW (ref 3.5–5.0)
Alkaline Phosphatase: 74 U/L (ref 38–126)
Anion gap: 8 (ref 5–15)
BUN: 8 mg/dL (ref 6–20)
CO2: 24 mmol/L (ref 22–32)
Calcium: 7.5 mg/dL — ABNORMAL LOW (ref 8.9–10.3)
Chloride: 101 mmol/L (ref 98–111)
Creatinine, Ser: 0.65 mg/dL (ref 0.44–1.00)
GFR calc Af Amer: >60 mL/min (ref >60)
GFR calc non Af Amer: >60 mL/min (ref >60)
Glucose, Bld: 79 mg/dL (ref 70–99)
Potassium: 4.6 mmol/L (ref 3.5–5.1)
Sodium: 133 mmol/L — ABNORMAL LOW (ref 135–145)
Total Bilirubin: 1.4 mg/dL — ABNORMAL HIGH (ref 0.3–1.2)
Total Protein: 7.4 g/dL (ref 6.5–8.1)

## 2018-01-19 LAB — CBC
HCT: 38.4 % (ref 36.0–46.0)
Hemoglobin: 13 g/dL (ref 12.0–15.0)
MCH: 29.1 pg (ref 26.0–34.0)
MCHC: 33.9 g/dL (ref 30.0–36.0)
MCV: 85.9 fL (ref 80.0–100.0)
Platelets: 300 10*3/uL (ref 150–400)
RBC: 4.47 MIL/uL (ref 3.87–5.11)
RDW: 20.2 % — ABNORMAL HIGH (ref 11.5–15.5)
WBC: 17.6 10*3/uL — ABNORMAL HIGH (ref 4.0–10.5)
nRBC: 0 % (ref 0.0–0.2)

## 2018-01-19 LAB — URIC ACID: Uric Acid, Serum: 5.4 mg/dL (ref 2.5–7.1)

## 2018-01-19 NOTE — Addendum Note (Signed)
Addendum  created 01/19/18 0837 by Lyda Jester, CRNA   Sign clinical note

## 2018-01-19 NOTE — Progress Notes (Signed)
Primary  OB MD;  Diuresing well Labs reviewed CBC    Component Value Date/Time   WBC 17.6 (H) 01/19/2018 0601   RBC 4.47 01/19/2018 0601   HGB 13.0 01/19/2018 0601   HGB 10.1 (L) 11/28/2017 1332   HCT 38.4 01/19/2018 0601   PLT 300 01/19/2018 0601   PLT 340 11/28/2017 1332   MCV 85.9 01/19/2018 0601   MCV 78.2 (A) 05/24/2017 1427   MCH 29.1 01/19/2018 0601   MCHC 33.9 01/19/2018 0601   RDW 20.2 (H) 01/19/2018 0601   LYMPHSABS 3.4 12/06/2017 1951   MONOABS 0.4 12/06/2017 1951   EOSABS 0.1 12/06/2017 1951   BASOSABS 0.0 12/06/2017 1951   CMP Latest Ref Rng & Units 01/19/2018 01/18/2018 01/18/2018  Glucose 70 - 99 mg/dL 79 85 80  BUN 6 - 20 mg/dL 8 7 8   Creatinine 0.44 - 1.00 mg/dL 0.65 0.43(L) 0.46  Sodium 135 - 145 mmol/L 133(L) 134(L) 135  Potassium 3.5 - 5.1 mmol/L 4.6 3.9 3.5  Chloride 98 - 111 mmol/L 101 102 103  CO2 22 - 32 mmol/L 24 22 21(L)  Calcium 8.9 - 10.3 mg/dL 7.5(L) 8.4(L) 8.8(L)  Total Protein 6.5 - 8.1 g/dL 7.4 6.3(L) 6.9  Total Bilirubin 0.3 - 1.2 mg/dL 1.4(H) 0.4 0.6  Alkaline Phos 38 - 126 U/L 74 61 61  AST 15 - 41 U/L 56(H) 41 40  ALT 0 - 44 U/L 69(H) 69(H) 68(H)   Patient Vitals for the past 24 hrs:  BP Temp Temp src Pulse Resp SpO2  01/19/18 0800 138/90 98.9 F (37.2 C) Oral 65 18 100 %  01/19/18 0700 - - - - 18 -  01/19/18 0600 - - - - 18 96 %  01/19/18 0500 - - - - 18 94 %  01/19/18 0406 122/70 98.2 F (36.8 C) Oral (!) 58 18 94 %  01/19/18 0300 - - - - 18 94 %  01/19/18 0200 - - - - - 93 %  01/19/18 0100 - - - - - 92 %  01/18/18 2359 109/69 97.7 F (36.5 C) Oral 80 18 97 %  01/18/18 2225 - - - - - 97 %  01/18/18 2224 134/87 - - 98 - -  01/18/18 2223 129/82 - - 90 - -  01/18/18 2219 121/80 - - 78 - -  01/18/18 2210 112/64 - - 66 18 97 %  01/18/18 2100 - - - - - 98 %  01/18/18 2057 134/68 - - 69 18 98 %  01/18/18 1957 130/74 97.8 F (36.6 C) Oral 65 18 99 %  01/18/18 1804 121/76 - - 72 18 -  01/18/18 1723 129/88 98.2 F (36.8 C)  Oral 71 15 97 %  01/18/18 1645 131/86 98.2 F (36.8 C) Oral 61 13 98 %  01/18/18 1630 136/84 - - 62 14 100 %  01/18/18 1615 (!) 113/102 - - 66 (!) 22 98 %  01/18/18 1600 130/82 - - 63 20 95 %  01/18/18 1547 (!) 127/106 97.9 F (36.6 C) Oral - (!) 22 96 %  01/18/18 1335 128/80 98.5 F (36.9 C) Oral 76 16 98 %  01/18/18 1200 125/83 - - 71 18 -  01/18/18 1102 (!) 139/96 - - 73 18 97 %  01/18/18 1002 (!) 152/92 - - 79 - 95 %  01/18/18 0950 (!) 149/93 - - - 16 -  01/18/18 0922 (!) 155/100 - - 70 - 98 %  01/18/18 0917 (!) 155/101 - - 73 -  99 %  01/18/18 0902 (!) 164/102 - - 67 - 100 %  01/18/18 0847 (!) 155/97 - - 73 - 98 %  note increase in AST however Will d/c magnesium 24 hrs from delivery. Cont BP med

## 2018-01-19 NOTE — Progress Notes (Signed)
Dr. Garwin Brothers called unit reviewed strip orders rec. To prepare pt for c/s.  Notified I called dr. Benjie Karvonen and told her about decel and she was going to discuss plan with dr. Garwin Brothers.

## 2018-01-19 NOTE — Lactation Note (Signed)
This note was copied from a baby's chart. Lactation Consultation Note  Patient Name: Jean Garcia [id number] Date: 01/19/2018  [name] is 75 hours old and mom is pumping every 3 hours.  She is discouraged she has not obtained any milk yet.  Reassured and discussed milk coming to volume.  Encouraged to call for assist/concerns prn.   Maternal Data    Feeding    LATCH Score                   Interventions    Lactation Tools Discussed/Used     Consult Status      Ave Filter 01/19/2018, 10:17 AM

## 2018-01-19 NOTE — Anesthesia Postprocedure Evaluation (Signed)
Anesthesia Post Note  Patient: Jean Garcia  Procedure(s) Performed: PRIMARY CLASSICAL CESAREAN SECTION AND MODIFIED POMEROY BILATERAL TUBAL LIGATION (N/A Abdomen)     Patient location during evaluation: [facility] Anesthesia Type: Spinal Level of consciousness: awake, awake and alert and oriented Pain management: pain level controlled Vital Signs Assessment: post-procedure vital signs reviewed and stable Respiratory status: spontaneous breathing Cardiovascular status: blood pressure returned to baseline and stable Postop Assessment: no headache, no backache, able to ambulate, adequate PO intake, no apparent nausea or vomiting and patient able to bend at knees Anesthetic complications: no    Last Vitals:  Vitals:   01/19/18 0700 01/19/18 0800  BP:  138/90  Pulse:  65  Resp: 18 18  Temp:  37.2 C  SpO2:  100%    Last Pain:  Vitals:   01/19/18 0800  TempSrc: Oral  PainSc:    Pain Goal: Patients Stated Pain Goal: 3 (01/14/18 0750)               Talitha Givens

## 2018-01-19 NOTE — Progress Notes (Signed)
Dr. Benjie Karvonen notified pt had a prolonged decel fhr down to 60's for 4 mins.  No new orders will continue to monitor and dr. Benjie Karvonen will contact dr. Garwin Brothers.

## 2018-01-20 LAB — CULTURE, BETA STREP (GROUP B ONLY)

## 2018-01-20 MED ORDER — LABETALOL HCL 100 MG PO TABS
100.0000 mg | ORAL_TABLET | Freq: Two times a day (BID) | ORAL | Status: DC
  Administered 2018-01-20 – 2018-01-21 (×2): 100 mg via ORAL
  Filled 2018-01-20 (×2): qty 1

## 2018-01-20 NOTE — Plan of Care (Signed)
  Problem: Education: Goal: Knowledge of condition will improve Outcome: Progressing

## 2018-01-20 NOTE — Lactation Note (Signed)
This note was copied from a baby's chart. Lactation Consultation Note  Patient Name: Jean Garcia [id number] Date: 01/20/2018  Mom is pumping every 3 hours but not obtaining milk.  Reassured and instructed to continue pumping and hand expression 8-12 times/24 hours.  Mom has a DEBP for home use.  Encouraged to call for assist prn.   Maternal Data    Feeding    LATCH Score                   Interventions    Lactation Tools Discussed/Used     Consult Status      Ave Filter 01/20/2018, 9:34 AM

## 2018-01-20 NOTE — Progress Notes (Signed)
Subjective: Low BP per RN at last reading. Pt asymptomatic. No HA/ dizziness. No SOB/ CP. Feels well.  Diet regular, no N/V. Pain well controlled. No heavy vag bleeding.  Pumping, no milk but has done before, cont q 3 hr pumping  Objective: Vital signs in last 24 hours: Temp:  [98.5 F (36.9 C)-99.6 F (37.6 C)] 98.8 F (37.1 C) (10/29 1207) Pulse Rate:  [62-86] 65 (10/29 1207) Resp:  [16-18] 18 (10/29 1207) BP: (97-140)/(62-101) 97/75 (10/29 1207) SpO2:  [97 %-100 %] 98 % (10/29 1207) Weight change:  Last BM Date: 01/17/18  Patient Vitals for the past 24 hrs:  BP Temp Temp src Pulse Resp SpO2  01/20/18 1207 97/75 98.8 F (37.1 C) Oral 65 18 98 %  01/20/18 0914 113/64 - - 67 - -  01/20/18 0823 (!) 140/101 99.6 F (37.6 C) Oral 86 18 100 %  01/20/18 0421 127/72 99.1 F (37.3 C) - 63 16 97 %  01/19/18 2342 117/78 98.5 F (36.9 C) - 64 16 100 %  01/19/18 1956 110/67 99 F (37.2 C) - 62 16 98 %  01/19/18 1606 105/62 98.5 F (36.9 C) Oral 66 18 97 %   Intake/Output from previous day: 10/28 0701 - 10/29 0700 In: 1469.5 [P.O.:720; I.V.:749.5] Out: 2575 [Urine:2575] Intake/Output this shift: Total I/O In: 720 [P.O.:720] Out: 1050 [Urine:1050]  General appearance: alert and cooperative Resp: clear to auscultation bilaterally Cardio: regular rate and rhythm, S1, S2 normal, no murmur, click, rub or gallop GI: normal findings: bowel sounds normal, soft, non-tender and uterus firm U-3 Extremities: extremities normal, atraumatic, no cyanosis or edema and Homans sign is negative, no sign of DVT Incision/Wound: drain intact, no erythema or hematoma, soft incision   Lab Results: CBC Latest Ref Rng & Units 01/19/2018 01/18/2018 01/18/2018  WBC 4.0 - 10.5 K/uL 17.6(H) 9.4 10.7(H)  Hemoglobin 12.0 - 15.0 g/dL 13.0 11.3(L) 11.3(L)  Hematocrit 36.0 - 46.0 % 38.4 34.1(L) 33.3(L)  Platelets 150 - 400 K/uL 300 280 281   CMP Latest Ref Rng & Units 01/19/2018 01/18/2018 01/18/2018   Glucose 70 - 99 mg/dL 79 85 80  BUN 6 - 20 mg/dL 8 7 8   Creatinine 0.44 - 1.00 mg/dL 0.65 0.43(L) 0.46  Sodium 135 - 145 mmol/L 133(L) 134(L) 135  Potassium 3.5 - 5.1 mmol/L 4.6 3.9 3.5  Chloride 98 - 111 mmol/L 101 102 103  CO2 22 - 32 mmol/L 24 22 21(L)  Calcium 8.9 - 10.3 mg/dL 7.5(L) 8.4(L) 8.8(L)  Total Protein 6.5 - 8.1 g/dL 7.4 6.3(L) 6.9  Total Bilirubin 0.3 - 1.2 mg/dL 1.4(H) 0.4 0.6  Alkaline Phos 38 - 126 U/L 74 61 61  AST 15 - 41 U/L 56(H) 41 40  ALT 0 - 44 U/L 69(H) 69(H) 68(H)   No new labs today.   Assessment/Plan: Post-op Primary Urgent C-section- Classical, POD #1.  CHTN, Superimposed PEC, s/p Magnesium and is stable, BPs well controlled, Will watch closely and will have to taper off Labetalol first and then possibly go down to Nifedipine XL 30mg  from 60mg  Pain well controlled.  Prot S deficiency, continue Lovenox (breast feeding planned, so defer coumadin) Post-op care , reportble s/s reviewed.    LOS: 8 days   Elveria Royals 01/20/2018, 1:13 PM Patient ID: Jean Garcia, female   DOB: 11-05-1975, 42 y.o.   MRN: [id number]

## 2018-01-21 DIAGNOSIS — O119 Pre-existing hypertension with pre-eclampsia, unspecified trimester: Secondary | ICD-10-CM

## 2018-01-21 MED ORDER — LABETALOL HCL 100 MG PO TABS: 100.0000 mg | ORAL_TABLET | Freq: Two times a day (BID) | ORAL | 3 refills | Status: AC

## 2018-01-21 MED ORDER — OXYCODONE-ACETAMINOPHEN 5-325 MG PO TABS: 1.0000 | ORAL_TABLET | ORAL | 0 refills | Status: AC | PRN

## 2018-01-21 MED ORDER — NIFEDIPINE ER 60 MG PO TB24: 60.0000 mg | ORAL_TABLET | Freq: Every day | ORAL | 3 refills | Status: AC

## 2018-01-21 NOTE — Progress Notes (Signed)
POSTOPERATIVE DAY # 3 S/P Primary Classical Cesarean Section, baby boy "Greyson" in NICU   S:         Reports feeling good, mixed emotions about going home today   Denies HA, visual changes, RUQ/epigastric pain              Tolerating po intake / no nausea / no vomiting / + flatus / no BM  Denies dizziness, SOB, or CP             Bleeding is light             Pain controlled with Percocet             Up ad lib / ambulatory/ voiding QS  Newborn in NICU - stable on ventilator per patient; mom is pumping every 3 hours and feels that her milk has come in   O:  VS: BP (!) 144/83 (BP Location: Right Arm)   Pulse 64   Temp 98.8 F (37.1 C) (Oral)   Resp 18   Ht 5\' 1"  (1.549 m)   Wt 82.6 kg   LMP 07/06/2017   SpO2 95%   Breastfeeding? Unknown   BMI 34.39 kg/m   Today's Vitals   01/21/18 0640 01/21/18 0813 01/21/18 0835 01/21/18 1010  BP:  (!) 144/83  (!) 147/82  Pulse:  64  67  Resp:  18    Temp:  98.8 F (37.1 C)    TempSrc:  Oral    SpO2:  95%    Weight:      Height:      PainSc: 4   0-No pain     LABS:               Recent Labs    01/18/18 1140 01/19/18 0601  WBC 9.4 17.6*  HGB 11.3* 13.0  PLT 280 300               Bloodtype: --/--/A POS (10/27 0601)  Rubella:                                               I&O: Intake/Output      10/29 0701 - 10/30 0700 10/30 0701 - 10/31 0700   P.O. 840    I.V. (mL/kg)     Total Intake(mL/kg) 840 (10.2)    Urine (mL/kg/hr) 1250 (0.6)    Total Output 1250    Net -410                      Physical Exam:             Alert and Oriented X3  Lungs: Clear and unlabored  Heart: regular rate and rhythm / no murmurs  Abdomen: soft, non-tender, non-distended, active bowel sounds in all quadrants              Fundus: firm, non-tender, U-3             Dressing: wound vac in place/ dry, intact, no drainage              Incision:  approximated with sutures / no erythema / no ecchymosis / no drainage  Perineum: intact  Lochia:  small, no clots   Extremities: +1 BLE edema, no calf pain or tenderness,   A/P:     POD # 3 S/P  Primary Classical cesarean section             CHTN with superimposed PEC    - s/p Magnesium sulfate   - Labetalol 100mg  BID   - Procardia 60mg  XL daily    - Pre-clampsia warning s/s reviewed   Protein S Deficiency    - Continue Lovenox (Breastfeeding planned, so defer Coumadin)   Microcytic Anemia   - s/p 2 IV iron infusions   Hx. Of gastric sleeve   Discharge home today   WOB discharge book given, warning s/s and instructions reviewed             F/u at our office on Friday for Wound Vac removal and BP check   Lars Pinks, MSN, CNM [facility]

## 2018-01-21 NOTE — Discharge Summary (Addendum)
Obstetric Discharge Summary   Patient Name: Jean Garcia DOB: September 22, 1975 MRN: [id number]  Date of Admission: 01/12/2018 Date of Discharge: 01/21/2018 Date of Delivery: 01/18/2018 Gestational Age at Delivery: [redacted]w[redacted]d  Primary OB: [facility] - Dr. Garwin Brothers  Antepartum complications:  - Chronic hypertension with superimposed pre-eclampsia  - AMA - Protein S Deficiency on Lovenox  - Hx. Gastric Sleeve - Abnormal Quad DSR 1:10, but Mat21 negative  - Microcytic hypochromic anemia Abnormal fetal testing( dopplers))  Fetal heart rate decelerations Prenatal Labs:  ABO, Rh: --/--/A POS, A POS Performed at [facility], [address], [place], [place] [address]  [phone] 1951) Antibody: NEG (09/14 1951) Rubella:  Immune RPR:   NR HBsAg:  neg  HIV:   neg GBS:   neg Admitting Diagnosis: Chronic Hypertension exacerbation in 2nd trimester  Secondary Diagnoses: Patient Active Problem List   Diagnosis Date Noted  . Chronic hypertension with superimposed pre-eclampsia 01/21/2018  . Postpartum care following cesarean delivery (10/27) 01/18/2018  . Chronic hypertension in obstetric context in second trimester 12/09/2017  . Iron deficiency anemia due to chronic blood loss 05/21/2017  . Major depressive disorder, single episode, moderate ([place]) 09/21/2013  . Anxiety state, unspecified 09/21/2013  desires sterilization  Date of Delivery: 01/18/18 Delivered By: Dr. Garwin Brothers Delivery Type: primary cesarean section, classical incision Anesthesia: epidural Placenta: spontaneous Laceration: n/a Episiotomy: none  Newborn Data: Live born female  Birth Weight: 1 lb 14.7 oz (870 g) APGAR: 8, 8  Newborn Delivery   Birth date/time:  01/18/2018 14:27:00 Delivery type:  C-Section, Classical Trial of labor:  No C-section categorization:  Primary        Hospital/Postpartum Course  (Cesarean Section):  Pt. Admitted on 01/12/18 with chronic hypertension exacerbation.Patient was placed  on continuous fetal monitoring. She was found to have abnormal dopplers. Pt was given BMZ x 2. She subsequently had spontaneous deceleration and continued abnormal dopplers. Pt was switch to heparin in the event of possible delivery. MFM consult for co-management obtained as well.   It progressed to superimposed pre-eclampsia on the day of delivery. She was started on magnesium for neuro prophylaxis as well as superimposed preeclampsia. PTT was found to be elevated and pt was given protamine to reverse the anticoagulation. Pt continued to have fetal heart rate deceleration. Pt expressed desire for permanent sterilization. .  She delivered on 01/18/18 for worsening clinical status - see notes for details.  She required 24 hours of IV Magnesium sulfate postpartum.  Her BPs are stable on Labetalol 100mg  BID and Procardia 60mg  XL daily. By time of discharge on POD#3, her pain was controlled on oral pain medications; she had appropriate lochia and was ambulating, voiding without difficulty, tolerating regular diet and passing flatus.   She was deemed stable for discharge to home.     Labs: CBC Latest Ref Rng & Units 01/19/2018 01/18/2018 01/18/2018  WBC 4.0 - 10.5 K/uL 17.6(H) 9.4 10.7(H)  Hemoglobin 12.0 - 15.0 g/dL 13.0 11.3(L) 11.3(L)  Hematocrit 36.0 - 46.0 % 38.4 34.1(L) 33.3(L)  Platelets 150 - 400 K/uL 300 280 281   A POS  Physical exam:  BP (!) 147/82   Pulse 67   Temp 98.8 F (37.1 C) (Oral)   Resp 18   Ht 5\' 1"  (1.549 m)   Wt 82.6 kg   LMP 07/06/2017   SpO2 95%   Breastfeeding? Unknown   BMI 34.39 kg/m  General: alert and no distress Pulm: normal respiratory effort Lochia: appropriate Abdomen: soft, NT Uterine Fundus:  firm, below umbilicus Perineum: healing well, no significant erythema, no significant edema Incision: wound vac in place, c/d/i, healing well, no significant drainage, no dehiscence, no significant erythema Extremities: No evidence of DVT seen on physical exam. No  lower extremity edema.   Disposition: stable, discharge to home Baby Feeding: mom pumping  Baby Disposition: NICU  Contraception: BTL  Rh Immune globulin given: N/A Rubella vaccine given: N/A Tdap vaccine given in AP or PP setting: given 01/19/18 Flu vaccine given in AP or PP setting: UTD   Plan:  Jean Garcia was discharged to home in good condition. Follow-up appointment at [facility] in 2 days for PICO removal  Discharge Instructions: Per After Visit Summary. Refer to After Visit Summary and [facility] discharge booklet  Activity: Advance as tolerated. Pelvic rest for 6 weeks.   Diet: low sodium , Heart Healthy Discharge Medications: Allergies as of 01/21/2018      Reactions   Ibuprofen Swelling   in hands, face and feet      Medication List    STOP taking these medications   acetaminophen 500 MG tablet Commonly known as:  TYLENOL     TAKE these medications   enoxaparin 40 MG/0.4ML injection Commonly known as:  LOVENOX Inject 0.4 mLs (40 mg total) into the skin daily.   labetalol 100 MG tablet Commonly known as:  NORMODYNE Take 1 tablet (100 mg total) by mouth 2 (two) times daily. What changed:    medication strength  how much to take   NIFEdipine 60 MG 24 hr tablet Commonly known as:  ADALAT CC Take 1 tablet (60 mg total) by mouth daily. Start taking on:  01/22/2018 What changed:    medication strength  how much to take   oxyCODONE-acetaminophen 5-325 MG tablet Commonly known as:  PERCOCET/ROXICET Take 1 tablet by mouth every 4 (four) hours as needed (pain scale 4-7).   PRENATAL VITAMIN PO Take 1 tablet by mouth daily.   senna 8.6 MG Tabs tablet Commonly known as:  SENOKOT Take 1 tablet by mouth daily as needed for mild constipation.      Outpatient follow up:  Follow-up Information    Servando Salina, MD. Schedule an appointment as soon as possible for a visit in 2 day(s).   Specialty:  Obstetrics and Gynecology Why:   Wound vac removal and BP check at [facility] information: [place] [address] [phone]           Signed:  Lars Pinks, MSN, CNM [facility]

## 2018-01-21 NOTE — Clinical Social Work Maternal (Signed)
CLINICAL SOCIAL WORK MATERNAL/CHILD NOTE  Patient Details  Name: Jean Garcia MRN: [id number] Date of Birth: 09-Jan-1976  Date:  01/21/2018  Clinical Social Worker Initiating Note:  Laurey Arrow Date/Time: Initiated:  01/20/18/1315     Child's Name:  Jean Garcia   Biological Parents:  Mother, Father   Need for Interpreter:  None   Reason for Referral:  Parental Support of Children with Anomalies/Syndromes, Other (Comment)(EDPS score of 13 and hx of anxiety and depression. )   Address:  [place] [address]    Phone number:  [phone] (home)     Additional phone number: FOB's number is [phone]  Household Members/Support Persons (HM/SP):   Household Member/Support Person 1, Household Member/Support Person 2, Household Member/Support Person 3(MOB has 2 adult children and 3 school aged children.)   HM/SP Name Relationship DOB or Age  HM/SP -1 Jean Garcia son 04/30/2008  HM/SP -2 Jean Garcia son 06/20/10  HM/SP -3 Jean Garcia FOB  03/06/1975  HM/SP -4        HM/SP -5        HM/SP -6        HM/SP -7        HM/SP -8          Natural Supports (not living in the home):  Immediate Family, Extended Family(Per MOB, FOB's family will also provide supports. )   Professional Supports: None   Employment: Full-time   Type of Work: MOB works for [employer]:      Homebound arranged:    Museum/gallery curator Resources:  Multimedia programmer   Other Resources:      Cultural/Religious Considerations Which May Impact Care:  Per [employer] Sheet, MOB is Non-Denominational  Strengths:  Ability to meet basic needs , Home prepared for child , Understanding of illness   Psychotropic Medications:         Pediatrician:       Pediatrician List:   [place]      Pediatrician Fax Number:    Risk Factors/Current Problems:  Mental Health Concerns     Cognitive State:  Able to Concentrate , Alert , Insightful , Linear Thinking , Goal Oriented    Mood/Affect:  Happy , Calm , Comfortable , Relaxed , Interested    CSW Assessment: CSW met with MOB in room 317 to complete an assessment for EDPS score of 13, NICU admission, and MH hx. When CSW arrived, MOB was resting and bed.  CSW explained CSW's role and encouraged MOB to ask questions.   CSW asked about MOB's thoughts and feelings regarding infant's NICU admission and MOB expressed feelings of sadness and concerns for infant. CSW validated MOB's thoughts and feelings. CSW discussed common emotions often experienced related to a NICU admission as well as during the first couple weeks of the postpartum period.  CSW also reviewed MOB's EDPS results and processed MOB's answers. CSW provided education regarding Baby Blues vs PMADs and provided MOB with resources for mental health follow up.  CSW encouraged MOB to evaluate her mental health throughout the postpartum period with the use of the New Mom Checklist developed by Postpartum Progress as well as the [place] Postnatal Depression Scale and notify a medical professional if symptoms arise. MOB presented with insight and awareness and did not demonstrate any acute MH signs or symtoms.CSW assessed for  safety and MOB denied SI, HI, and DV.  MOB reported having a good support team and feeling prepared to parent infant once he is ready for discharge.   CSW also provided information regarding apply for SSI benefits for infant and MOB was interested. MOB signed all required documents and CSW made MOB aware of SSI application process.   NICU visitation policy was reviewed.  CSW will continue to offer resources and supports to family while infant remains in NICU.    CSW Plan/Description:  Psychosocial Support and Ongoing Assessment of Needs, Perinatal Mood and Anxiety Disorder (PMADs) Education, [employer] Income (SSI) Information    Laurey Arrow, MSW, LCSW Clinical Social Work [phone]   Dimple Nanas, LCSW 01/21/2018, 11:23 AM

## 2018-01-21 NOTE — Plan of Care (Signed)
Infant in NICU

## 2018-01-21 NOTE — Progress Notes (Signed)
CSW acknowledges consult and completed clinical assessment.  Clinical documentation will follow.  There are no barriers to d/c.  Curby Carswell Boyd-Gilyard, MSW, LCSW Clinical Social Work [phone]   

## 2018-01-21 NOTE — Lactation Note (Addendum)
This note was copied from a baby's chart. Lactation Consultation Note; Follow up visit with this mom of NICU baby born at [address]. She reports she pumped 2 times yesterday but did not obtain any milk. Reports breasts are feeling much fuller this morning and when she pumped at 6:30 am she still did not obtain any milk.Did notice she was leaking. Assisted with pumping sitting up in the chair. Encouraged breast massage before pumping and hand expression after pumping. Mom able to pump about 5 ml whitish milk. Reports breasts feel slightly softer. Reviewed engorgement prevention and treatment. Encouraged to pump q 3 hours. Has Medela pump for home. Reviewed pumping rooms in NICU and can bring pump pieces when she comes to visit baby and pump while she is here. No questions at present. Encouragement given. Reviewed our phone number to call with questions and when she needs assist with latching baby.    Patient Name: Jean Garcia MOLMB'E Date: 01/21/2018 Reason for consult: Follow-up assessment;Preterm <34wks;NICU baby   Maternal Data Has patient been taught Hand Expression?: Yes Does the patient have breastfeeding experience prior to this delivery?: Yes  Feeding    LATCH Score       Type of Nipple: Everted at rest and after stimulation  Comfort (Breast/Nipple): Filling, red/small blisters or bruises, mild/mod discomfort        Interventions Interventions: Hand express;Expressed milk  Lactation Tools Discussed/Used WIC Program: No   Consult Status Consult Status: Complete    [name] 01/21/2018, 9:33 AM

## 2018-01-21 NOTE — Progress Notes (Signed)
Patient given discharge instructions, follow-up for Friday for dressing change and BP check. Pt will call to schedule. Baby and me book given and reviewed, Pre-e s/s reviewed, PP care, incision care, and medications reviewed. Pt verbalizes understanding.

## 2018-01-22 LAB — BPAM RBC
Blood Product Expiration Date: 201911192359
Blood Product Expiration Date: 201911192359
Unit Type and Rh: 600
Unit Type and Rh: 600

## 2018-01-22 LAB — TYPE AND SCREEN
ABO/RH(D): A POS
Antibody Screen: NEGATIVE
Unit division: 0
Unit division: 0

## 2018-01-29 ENCOUNTER — Other Ambulatory Visit: Payer: Self-pay

## 2018-01-29 ENCOUNTER — Ambulatory Visit: Payer: Self-pay | Admitting: Hematology & Oncology

## 2018-01-30 ENCOUNTER — Other Ambulatory Visit: Payer: Self-pay

## 2018-01-30 ENCOUNTER — Ambulatory Visit: Payer: Self-pay | Admitting: Hematology & Oncology

## 2018-03-27 ENCOUNTER — Telehealth (HOSPITAL_COMMUNITY): Payer: Self-pay | Admitting: Lactation Services

## 2018-03-27 NOTE — Telephone Encounter (Signed)
Mom called concerned that milk supply has decreased over the last 3 days. Baby in NICU, born at 65 [address] in October. Now pumping about 30 mls/pumping. Is pumping 4-5 times/day. Was drinking Mother's Milk Tea but has run out of it. Feels it did help. Discussed power pumping once a day, increasing pumping to 6-8 times/day and to continue drinking tea and plenty of fluids. Encouragement given. Baby is not latching to breast now. Encouraged to call for assist when baby is latching. No further questions.

## 2019-03-24 ENCOUNTER — Ambulatory Visit: Payer: HRSA Program | Attending: Internal Medicine

## 2019-03-24 DIAGNOSIS — Z20828 Contact with and (suspected) exposure to other viral communicable diseases: Secondary | ICD-10-CM | POA: Diagnosis not present

## 2019-03-24 DIAGNOSIS — Z20822 Contact with and (suspected) exposure to covid-19: Secondary | ICD-10-CM

## 2019-03-25 LAB — NOVEL CORONAVIRUS, NAA: SARS-CoV-2, NAA: NOT DETECTED

## 2022-04-15 ENCOUNTER — Encounter: Payer: Self-pay | Admitting: *Deleted

## 2022-04-15 NOTE — Progress Notes (Signed)
Call from [facility] (Dr. Nicholes Rough) at [phone] to inquire of referral that was faxed on 04/10/22. Dx: IDA.

## 2022-04-19 ENCOUNTER — Other Ambulatory Visit: Payer: Self-pay | Admitting: Family

## 2022-04-19 ENCOUNTER — Encounter: Payer: Self-pay | Admitting: Family

## 2022-04-19 ENCOUNTER — Inpatient Hospital Stay: Payer: BC Managed Care – PPO | Attending: Hematology & Oncology

## 2022-04-19 ENCOUNTER — Inpatient Hospital Stay (HOSPITAL_BASED_OUTPATIENT_CLINIC_OR_DEPARTMENT_OTHER): Payer: BC Managed Care – PPO | Admitting: Family

## 2022-04-19 VITALS — BP 152/92 | HR 67 | Temp 98.8°F | Resp 17 | Ht 61.0 in | Wt 181.8 lb

## 2022-04-19 DIAGNOSIS — I10 Essential (primary) hypertension: Secondary | ICD-10-CM

## 2022-04-19 DIAGNOSIS — D500 Iron deficiency anemia secondary to blood loss (chronic): Secondary | ICD-10-CM

## 2022-04-19 DIAGNOSIS — N920 Excessive and frequent menstruation with regular cycle: Secondary | ICD-10-CM | POA: Diagnosis not present

## 2022-04-19 DIAGNOSIS — K912 Postsurgical malabsorption, not elsewhere classified: Secondary | ICD-10-CM | POA: Insufficient documentation

## 2022-04-19 DIAGNOSIS — D6959 Other secondary thrombocytopenia: Secondary | ICD-10-CM | POA: Diagnosis not present

## 2022-04-19 DIAGNOSIS — D6859 Other primary thrombophilia: Secondary | ICD-10-CM | POA: Diagnosis not present

## 2022-04-19 DIAGNOSIS — D649 Anemia, unspecified: Secondary | ICD-10-CM

## 2022-04-19 LAB — CBC WITH DIFFERENTIAL (CANCER CENTER ONLY)
Abs Immature Granulocytes: 0.06 10*3/uL (ref 0.00–0.07)
Basophils Absolute: 0.1 10*3/uL (ref 0.0–0.1)
Basophils Relative: 1 %
Eosinophils Absolute: 0.2 10*3/uL (ref 0.0–0.5)
Eosinophils Relative: 2 %
HCT: 26.4 % — ABNORMAL LOW (ref 36.0–46.0)
Hemoglobin: 7 g/dL — ABNORMAL LOW (ref 12.0–15.0)
Immature Granulocytes: 1 %
Lymphocytes Relative: 42 %
Lymphs Abs: 2.6 10*3/uL (ref 0.7–4.0)
MCH: 18.4 pg — ABNORMAL LOW (ref 26.0–34.0)
MCHC: 26.5 g/dL — ABNORMAL LOW (ref 30.0–36.0)
MCV: 69.3 fL — ABNORMAL LOW (ref 80.0–100.0)
Monocytes Absolute: 0.6 10*3/uL (ref 0.1–1.0)
Monocytes Relative: 9 %
Neutro Abs: 2.8 10*3/uL (ref 1.7–7.7)
Neutrophils Relative %: 45 %
Platelet Count: 722 10*3/uL — ABNORMAL HIGH (ref 150–400)
RBC: 3.81 MIL/uL — ABNORMAL LOW (ref 3.87–5.11)
RDW: 18.7 % — ABNORMAL HIGH (ref 11.5–15.5)
Smear Review: NORMAL
WBC Count: 6.3 10*3/uL (ref 4.0–10.5)
nRBC: 0 % (ref 0.0–0.2)

## 2022-04-19 LAB — CMP (CANCER CENTER ONLY)
ALT: 8 U/L (ref 0–44)
AST: 13 U/L — ABNORMAL LOW (ref 15–41)
Albumin: 4 g/dL (ref 3.5–5.0)
Alkaline Phosphatase: 54 U/L (ref 38–126)
Anion gap: 8 (ref 5–15)
BUN: 9 mg/dL (ref 6–20)
CO2: 26 mmol/L (ref 22–32)
Calcium: 9 mg/dL (ref 8.9–10.3)
Chloride: 105 mmol/L (ref 98–111)
Creatinine: 0.73 mg/dL (ref 0.44–1.00)
GFR, Estimated: >60 mL/min (ref >60)
Glucose, Bld: 106 mg/dL — ABNORMAL HIGH (ref 70–99)
Potassium: 3.6 mmol/L (ref 3.5–5.1)
Sodium: 139 mmol/L (ref 135–145)
Total Bilirubin: 0.4 mg/dL (ref 0.3–1.2)
Total Protein: 7.5 g/dL (ref 6.5–8.1)

## 2022-04-19 LAB — RETICULOCYTES
Immature Retic Fract: 23.6 % — ABNORMAL HIGH (ref 2.3–15.9)
RBC.: 3.87 MIL/uL (ref 3.87–5.11)
Retic Count, Absolute: 53.4 10*3/uL (ref 19.0–186.0)
Retic Ct Pct: 1.4 % (ref 0.4–3.1)

## 2022-04-19 LAB — SAMPLE TO BLOOD BANK

## 2022-04-19 LAB — LACTATE DEHYDROGENASE: LDH: 135 U/L (ref 98–192)

## 2022-04-19 LAB — FERRITIN: Ferritin: 2 ng/mL — ABNORMAL LOW (ref 11–307)

## 2022-04-19 NOTE — Progress Notes (Signed)
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  No other blood loss noted. No abnormal bruising, no petechiae.  She has had an IUD placed top help regulate her cycle.  Iron saturation last week was 3%. Hgb 7.0, MCV 69, platelets 722 and WBC count 6.3.  She is symptomatic with fatigue, occasional SOB with exertion (chasing her Jean Garcia), chills and craving ice/starches. She does not appear toxic at this time.  No history of sickle cell disease or trait.  She has 6 children and history of 2 miscarriages.  She was seen and treated by Dr. Marin Olp back in 2019 during her pregnancy for history of miscarriage and protein S deficiency.  No diabetes or thyroid disease.  She is scheduled for colonoscope in March 2024.  No personal or known familial history of cancer.  No fever, n/v, cough, rash, dizziness, chest pain, palpitations, abdominal pain or changes in bowel or bladder habits.  No tenderness, numbness or tingling in her extremities.  She has swelling in her feet and ankles comes and goes with chronic HTN.  No falls or syncope.  No smoking or recreational drug use.  ETOH socially, wine.  Appetite and hydration are good. Weight is stable at 181 lbs.  She works for [employer].   ROS: All other 10 point review of systems is negative.   PAST MEDICAL HISTORY:   Past Medical History:  Diagnosis Date   Hypertension    Protein S deficiency ([place])     ALLERGIES: Allergies  Allergen Reactions   Ibuprofen Swelling    in hands, face and feet       MEDICATIONS:  Current Outpatient Medications on File Prior to Visit  Medication Sig Dispense Refill   enoxaparin (LOVENOX) 40 MG/0.4ML injection Inject 0.4 mLs (40 mg total) into the skin daily. 30 Syringe 9   labetalol (NORMODYNE) 100 MG tablet Take 1 tablet (100 mg total) by mouth 2 (two) times daily. 60 tablet 3   NIFEdipine (ADALAT CC) 60 MG 24 hr tablet Take 1 tablet (60 mg total) by mouth daily. 30 tablet 3   oxyCODONE-acetaminophen (PERCOCET/ROXICET) 5-325 MG tablet Take 1 tablet by mouth every 4 (four) hours as needed (pain scale 4-7). 20 tablet 0   Prenatal Vit-Fe Fumarate-FA (PRENATAL VITAMIN PO) Take 1 tablet by mouth daily.      senna (SENOKOT) 8.6 MG TABS tablet Take 1 tablet by mouth daily as needed for mild constipation.     No current facility-administered medications on file prior to visit.     PAST SURGICAL HISTORY Past Surgical History:  Procedure Laterality Date   CESAREAN SECTION N/A 01/18/2018   Procedure: PRIMARY CLASSICAL CESAREAN SECTION AND MODIFIED POMEROY BILATERAL TUBAL LIGATION;  Surgeon: Servando Salina, MD;  Location: [facility];  Service: Obstetrics;  Laterality: N/A;   NO PAST SURGERIES      FAMILY HISTORY: Family History  Problem Relation Age of Onset   Alcohol abuse Neg Hx    Anxiety disorder Neg Hx    Bipolar disorder Neg  Hx    Depression Neg Hx    Drug abuse Neg Hx     SOCIAL HISTORY:  reports that she has never smoked. She has never used smokeless tobacco. She reports that she does not drink alcohol and does not use drugs.  PERFORMANCE STATUS: The patient's performance status is 1 - Symptomatic but completely ambulatory  PHYSICAL EXAM: Most Recent Vital Signs: unknown if currently breastfeeding. There were no vitals taken for this visit.  General Appearance:    Alert, cooperative, no distress, appears stated age  Head:    Normocephalic, without obvious abnormality, atraumatic  Eyes:    PERRL, conjunctiva/corneas clear,  EOM's intact, fundi    benign, both eyes        Throat:   Lips, mucosa, and tongue normal; teeth and gums normal  Neck:   Supple, symmetrical, trachea midline, no adenopathy;    thyroid:  no enlargement/tenderness/nodules; no carotid   bruit or JVD  Back:     Symmetric, no curvature, ROM normal, no CVA tenderness  Lungs:     Clear to auscultation bilaterally, respirations unlabored  Chest Wall:    No tenderness or deformity   Heart:    Regular rate and rhythm, S1 and S2 normal, no murmur, rub   or gallop     Abdomen:     Soft, non-tender, bowel sounds active all four quadrants,    no masses, no organomegaly        Extremities:   Extremities normal, atraumatic, no cyanosis or edema  Pulses:   2+ and symmetric all extremities  Skin:   Skin color, texture, turgor normal, no rashes or lesions  Lymph nodes:   Cervical, supraclavicular, and axillary nodes normal  Neurologic:   CNII-XII intact, normal strength, sensation and reflexes    throughout    LABORATORY DATA:  No results found for this or any previous visit (from the past 48 hour(s)).    RADIOGRAPHY: No results found.     PATHOLOGY: None  ASSESSMENT/PLAN: Jean Garcia is a very pleasant 47 yo African American female with iron deficiency anemia secondary to heavy cycles.  We will get her set up for 2 doses of IV iron starting next week.  If her symptoms worsen over the weekend she will go to the ED.  Anemia work up pending. She may also benefit from folic acid.  Follow-up in 1 month.   All questions were answered. The patient knows to call the clinic with any problems, questions or concerns. We can certainly see the patient much sooner if necessary.   Lottie Dawson, NP

## 2022-04-22 LAB — IRON AND IRON BINDING CAPACITY (CC-WL,HP ONLY)
Iron: 9 ug/dL — ABNORMAL LOW (ref 28–170)
Saturation Ratios: 2 % — ABNORMAL LOW (ref 10.4–31.8)
TIBC: 413 ug/dL (ref 250–450)
UIBC: 404 ug/dL (ref 148–442)

## 2022-04-23 LAB — HGB FRACTIONATION CASCADE
Hgb A2: 1.7 % — ABNORMAL LOW (ref 1.8–3.2)
Hgb A: 98.3 % (ref 96.4–98.8)
Hgb F: 0 % (ref 0.0–2.0)
Hgb S: 0 %

## 2022-04-23 LAB — ERYTHROPOIETIN: Erythropoietin: 147.1 m[IU]/mL — ABNORMAL HIGH (ref 2.6–18.5)

## 2022-04-25 ENCOUNTER — Inpatient Hospital Stay: Payer: BC Managed Care – PPO | Attending: Hematology & Oncology

## 2022-04-25 ENCOUNTER — Other Ambulatory Visit: Payer: Self-pay | Admitting: Medical Oncology

## 2022-04-25 VITALS — BP 154/86 | HR 63 | Temp 98.6°F

## 2022-04-25 DIAGNOSIS — D500 Iron deficiency anemia secondary to blood loss (chronic): Secondary | ICD-10-CM

## 2022-04-25 DIAGNOSIS — Z789 Other specified health status: Secondary | ICD-10-CM

## 2022-04-25 DIAGNOSIS — N920 Excessive and frequent menstruation with regular cycle: Secondary | ICD-10-CM | POA: Insufficient documentation

## 2022-04-25 MED ORDER — SODIUM CHLORIDE 0.9 % IV SOLN: Freq: Once | INTRAVENOUS | Status: DC

## 2022-04-25 MED ORDER — SODIUM CHLORIDE 0.9 % IV SOLN
510.0000 mg | Freq: Once | INTRAVENOUS | Status: DC
  Filled 2022-04-25: qty 17

## 2022-04-25 NOTE — Progress Notes (Signed)
After 4 failed IV attempts patient sent home without receiving IV iron as scheduled today. Nelwyn Salisbury, PA notified. Patient will have port placed. Patient verbalized understanding and was thankful for a solution to her poor venous access. Patient discharged ambulatory without complications or complaints.

## 2022-04-30 ENCOUNTER — Other Ambulatory Visit: Payer: Self-pay | Admitting: Radiology

## 2022-05-01 ENCOUNTER — Other Ambulatory Visit: Payer: Self-pay | Admitting: Medical Oncology

## 2022-05-01 ENCOUNTER — Ambulatory Visit (HOSPITAL_COMMUNITY)
Admission: RE | Admit: 2022-05-01 | Discharge: 2022-05-01 | Disposition: A | Discharge status: Home / Self Care | Payer: BC Managed Care – PPO | Source: Ambulatory Visit | Attending: Hematology & Oncology | Admitting: Hematology & Oncology

## 2022-05-01 ENCOUNTER — Ambulatory Visit (HOSPITAL_COMMUNITY)
Admission: RE | Admit: 2022-05-01 | Discharge: 2022-05-01 | Disposition: A | Discharge status: Home / Self Care | Payer: BC Managed Care – PPO | Source: Ambulatory Visit | Attending: Medical Oncology | Admitting: Medical Oncology

## 2022-05-01 ENCOUNTER — Encounter (HOSPITAL_COMMUNITY): Payer: Self-pay

## 2022-05-01 ENCOUNTER — Other Ambulatory Visit: Payer: Self-pay

## 2022-05-01 DIAGNOSIS — Z789 Other specified health status: Secondary | ICD-10-CM

## 2022-05-01 DIAGNOSIS — D6859 Other primary thrombophilia: Secondary | ICD-10-CM | POA: Diagnosis not present

## 2022-05-01 DIAGNOSIS — D500 Iron deficiency anemia secondary to blood loss (chronic): Secondary | ICD-10-CM | POA: Insufficient documentation

## 2022-05-01 HISTORY — PX: IR IMAGING GUIDED PORT INSERTION: IMG5740

## 2022-05-01 HISTORY — PX: IR VENIPUNCTURE 3YRS/OLDER BY MD: IMG2287

## 2022-05-01 HISTORY — PX: IR US GUIDE VASC ACCESS RIGHT: IMG2390

## 2022-05-01 MED ORDER — FENTANYL CITRATE (PF) 100 MCG/2ML IJ SOLN
INTRAMUSCULAR | Status: AC | PRN
  Administered 2022-05-01 (×2): 50 ug via INTRAVENOUS

## 2022-05-01 MED ORDER — FENTANYL CITRATE (PF) 100 MCG/2ML IJ SOLN
INTRAMUSCULAR | Status: AC
  Filled 2022-05-01: qty 2

## 2022-05-01 MED ORDER — MIDAZOLAM HCL 2 MG/2ML IJ SOLN
INTRAMUSCULAR | Status: AC
  Filled 2022-05-01: qty 2

## 2022-05-01 MED ORDER — MIDAZOLAM HCL 2 MG/2ML IJ SOLN
INTRAMUSCULAR | Status: AC | PRN
  Administered 2022-05-01 (×2): 1 mg via INTRAVENOUS

## 2022-05-01 MED ORDER — HEPARIN SOD (PORK) LOCK FLUSH 100 UNIT/ML IV SOLN
INTRAVENOUS | Status: AC
  Administered 2022-05-01: 500 [IU] via INTRAVENOUS
  Filled 2022-05-01: qty 5

## 2022-05-01 MED ORDER — SODIUM CHLORIDE 0.9 % IV SOLN: INTRAVENOUS | Status: DC

## 2022-05-01 MED ORDER — LIDOCAINE-EPINEPHRINE 1 %-1:100000 IJ SOLN
INTRAMUSCULAR | Status: AC
  Administered 2022-05-01: 19 mL via INTRADERMAL
  Filled 2022-05-01: qty 1

## 2022-05-01 NOTE — Sedation Documentation (Signed)
Dr. Vernard Gambles in to attempt to gain access.

## 2022-05-01 NOTE — Sedation Documentation (Addendum)
Patient's IV not flowing. Unsure if previously given medication reached patient but will proceed as if she did receive it. Procedure halted until better access is obtained

## 2022-05-01 NOTE — Discharge Instructions (Signed)
Please call Interventional Radiology clinic [phone] with any questions or concerns.  You may remove your dressing and shower tomorrow.  DO NOT use EMLA cream on your port site for 2 weeks as this cream will remove surgical glue on your incision.  Implanted Port Insertion, Care After This sheet gives you information about how to care for yourself after your procedure. Your health care provider may also give you more specific instructions. If you have problems or questions, contact your health care provider. What can I expect after the procedure? After the procedure, it is common to have: Discomfort at the port insertion site. Bruising on the skin over the port. This should improve over 3-4 days. Follow these instructions at home: [facility] care After your port is placed, you will get a manufacturer's information card. The card has information about your port. Keep this card with you at all times. Take care of the port as told by your health care provider. Ask your health care provider if you or a family member can get training for taking care of the port at home. A home health care nurse may also take care of the port. Make sure to remember what type of port you have. Incision care Follow instructions from your health care provider about how to take care of your port insertion site. Make sure you: Wash your hands with soap and water before and after you change your bandage (dressing). If soap and water are not available, use hand sanitizer. Change your dressing as told by your health care provider. Leave stitches (sutures), skin glue, or adhesive strips in place. These skin closures may need to stay in place for 2 weeks or longer. If adhesive strip edges start to loosen and curl up, you may trim the loose edges. Do not remove adhesive strips completely unless your health care provider tells you to do that. Check your port insertion site every day for signs of infection. Check for: Redness,  swelling, or pain. Fluid or blood. Warmth. Pus or a bad smell.        Activity Return to your normal activities as told by your health care provider. Ask your health care provider what activities are safe for you. Do not lift anything that is heavier than 10 lb (4.5 kg), or the limit that you are told, until your health care provider says that it is safe. General instructions Take over-the-counter and prescription medicines only as told by your health care provider. Do not take baths, swim, or use a hot tub until your health care provider approves. Ask your health care provider if you may take showers. You may only be allowed to take sponge baths. Do not drive for 24 hours if you were given a sedative during your procedure. Wear a medical alert bracelet in case of an emergency. This will tell any health care providers that you have a port. Keep all follow-up visits as told by your health care provider. This is important. Contact a health care provider if: You cannot flush your port with saline as directed, or you cannot draw blood from the port. You have a fever or chills. You have redness, swelling, or pain around your port insertion site. You have fluid or blood coming from your port insertion site. Your port insertion site feels warm to the touch. You have pus or a bad smell coming from the port insertion site. Get help right away if: You have chest pain or shortness of breath. You have bleeding from  your port that you cannot control. Summary Take care of the port as told by your health care provider. Keep the manufacturer's information card with you at all times. Change your dressing as told by your health care provider. Contact a health care provider if you have a fever or chills or if you have redness, swelling, or pain around your port insertion site. Keep all follow-up visits as told by your health care provider. This information is not intended to replace advice given to you  by your health care provider. Make sure you discuss any questions you have with your health care provider. Document Revised: 10/07/2017 Document Reviewed: 10/07/2017 Elsevier Patient Education  2021 [place].   Moderate Conscious Sedation, Adult, Care After This sheet gives you information about how to care for yourself after your procedure. Your health care provider may also give you more specific instructions. If you have problems or questions, contact your health care provider. What can I expect after the procedure? After the procedure, it is common to have: Sleepiness for several hours. Impaired judgment for several hours. Difficulty with balance. Vomiting if you eat too soon. Follow these instructions at home: For the time period you were told by your health care provider: Rest. Do not participate in activities where you could fall or become injured. Do not drive or use machinery. Do not drink alcohol. Do not take sleeping pills or medicines that cause drowsiness. Do not make important decisions or sign legal documents. Do not take care of children on your own.        Eating and drinking Follow the diet recommended by your health care provider. Drink enough fluid to keep your urine pale yellow. If you vomit: Drink water, juice, or soup when you can drink without vomiting. Make sure you have little or no nausea before eating solid foods.    General instructions Take over-the-counter and prescription medicines only as told by your health care provider. Have a responsible adult stay with you for the time you are told. It is important to have someone help care for you until you are awake and alert. Do not smoke. Keep all follow-up visits as told by your health care provider. This is important. Contact a health care provider if: You are still sleepy or having trouble with balance after 24 hours. You feel light-headed. You keep feeling nauseous or you keep vomiting. You  develop a rash. You have a fever. You have redness or swelling around the IV site. Get help right away if: You have trouble breathing. You have new-onset confusion at home. Summary After the procedure, it is common to feel sleepy, have impaired judgment, or feel nauseous if you eat too soon. Rest after you get home. Know the things you should not do after the procedure. Follow the diet recommended by your health care provider and drink enough fluid to keep your urine pale yellow. Get help right away if you have trouble breathing or new-onset confusion at home. This information is not intended to replace advice given to you by your health care provider. Make sure you discuss any questions you have with your health care provider. Document Revised: 07/09/2019 Document Reviewed: 02/04/2019 Elsevier Patient Education  2021 [employer].

## 2022-05-01 NOTE — Consult Note (Signed)
Chief Complaint: Patient was seen in consultation today for port a cath placement  Referring Physician(s): Holland M  Supervising Physician: Arne Cleveland  Patient Status: [facility] - Out-pt  History of Present Illness: Jean Garcia is a 47 y.o. female with past medical history of iron deficiency anemia and protein S deficiency.  She has poor venous access and presents today for Port-A-Cath placement to assist with IV iron infusions.  Past Medical History:  Diagnosis Date   Hypertension    Protein S deficiency ([place])     Past Surgical History:  Procedure Laterality Date   CESAREAN SECTION N/A 01/18/2018   Procedure: PRIMARY CLASSICAL CESAREAN SECTION AND MODIFIED POMEROY BILATERAL TUBAL LIGATION;  Surgeon: Servando Salina, MD;  Location: [facility];  Service: Obstetrics;  Laterality: N/A;   NO PAST SURGERIES      Allergies: Ibuprofen  Medications: Prior to Admission medications   Medication Sig Start Date End Date Taking? Authorizing Provider  enoxaparin (LOVENOX) 40 MG/0.4ML injection Inject 0.4 mLs (40 mg total) into the skin daily. 09/16/17   Volanda Napoleon, MD  labetalol (NORMODYNE) 100 MG tablet Take 1 tablet (100 mg total) by mouth 2 (two) times daily. 01/21/18   Sigmon, Tyler Deis, CNM  NIFEdipine (ADALAT CC) 60 MG 24 hr tablet Take 1 tablet (60 mg total) by mouth daily. 01/22/18   Sigmon, Tyler Deis, CNM  oxyCODONE-acetaminophen (PERCOCET/ROXICET) 5-325 MG tablet Take 1 tablet by mouth every 4 (four) hours as needed (pain scale 4-7). 01/21/18   Sigmon, Tyler Deis, CNM  Prenatal Vit-Fe Fumarate-FA (PRENATAL VITAMIN PO) Take 1 tablet by mouth daily.     [provider]  Probiotic Product (PROBIOTIC DAILY PO) Take by mouth daily at 6 (six) AM.    [provider]  senna (SENOKOT) 8.6 MG TABS tablet Take 1 tablet by mouth daily as needed for mild constipation.    [provider]     Family History  Problem Relation Age  of Onset   Alcohol abuse Neg Hx    Anxiety disorder Neg Hx    Bipolar disorder Neg Hx    Depression Neg Hx    Drug abuse Neg Hx     Social History   Socioeconomic History   Marital status: Married    Spouse name: Not on file   Number of children: Not on file   Years of education: Not on file   Highest education level: Not on file  Occupational History   Not on file  Tobacco Use   Smoking status: Never   Smokeless tobacco: Never  Substance and Sexual Activity   Alcohol use: No   Drug use: No   Sexual activity: Not on file  Other Topics Concern   Not on file  Social History Narrative   Not on file   Social Determinants of Health   Financial Resource Strain: Low Risk  (12/06/2017)   Overall Financial Resource Strain (CARDIA)    Difficulty of Paying Living Expenses: Not hard at all  Food Insecurity: No Food Insecurity (12/06/2017)   Hunger Vital Sign    Worried About Running Out of Food in the Last Year: Never true    Ran Out of Food in the Last Year: Never true  Transportation Needs: No Transportation Needs (12/06/2017)   PRAPARE - Hydrologist (Medical): No    Lack of Transportation (Non-Medical): No  Physical Activity: Not on file  Stress: No Stress Concern Present (12/06/2017)  [place] Questionnaire    Feeling of Stress : Only a little  Social Connections: Not on file      Review of Systems currently denies fever, headache, chest pain, dyspnea, cough, abdominal/back pain, nausea, vomiting or bleeding.  Vital Signs: Vitals:   05/01/22 0757  BP: (!) 144/94  Pulse: 64  Resp: 18  Temp: 98 F (36.7 C)  SpO2: 99%   Code Status: FULL CODE    Physical Exam awake, alert.  Chest clear to auscultation bilaterally.  Heart with regular rate and rhythm.  Abdomen soft, positive bowel sounds, nontender.  No lower extremity edema.  Imaging: No results found.  Labs:  CBC: Recent  Labs    04/19/22 1445  WBC 6.3  HGB 7.0*  HCT 26.4*  PLT 722*    COAGS: No results for input(s): "INR", "APTT" in the last 8760 hours.  BMP: Recent Labs    04/19/22 1445  NA 139  K 3.6  CL 105  CO2 26  GLUCOSE 106*  BUN 9  CALCIUM 9.0  CREATININE 0.73  GFRNONAA >60    LIVER FUNCTION TESTS: Recent Labs    04/19/22 1445  BILITOT 0.4  AST 13*  ALT 8  ALKPHOS 54  PROT 7.5  ALBUMIN 4.0    TUMOR MARKERS: No results for input(s): "AFPTM", "CEA", "CA199", "CHROMGRNA" in the last 8760 hours.  Assessment and Plan: 47 y.o. female with past medical history of iron deficiency anemia and protein S deficiency.  She has poor venous access and presents today for Port-A-Cath placement to assist with IV iron infusions.Risks and benefits of image guided port-a-catheter placement was discussed with the patient including, but not limited to bleeding, infection, pneumothorax, or fibrin sheath development and need for additional procedures.  All of the patient's questions were answered, patient is agreeable to proceed. Consent signed and in chart.    Thank you for this interesting consult.  I greatly enjoyed meeting Jean Garcia and look forward to participating in their care.  A copy of this report was sent to the requesting provider on this date.  Electronically Signed: D. Rowe Robert, PA-C 05/01/2022, 7:56 AM   I spent a total of  25 minutes   in face to face in clinical consultation, greater than 50% of which was counseling/coordinating care for port a cath placement

## 2022-05-01 NOTE — Procedures (Signed)
  Procedure:  R IJ Port catheter placement   Preprocedure diagnosis: Diagnoses of Iron deficiency anemia due to chronic blood loss and Difficult intravenous access were pertinent to this visit.  Postprocedure diagnosis: same EBL:    minimal Complications:   none immediate  See full dictation in [facility].  Dillard Cannon MD Main # [phone] Pager  [phone] Mobile [phone]

## 2022-05-02 ENCOUNTER — Inpatient Hospital Stay: Payer: BC Managed Care – PPO

## 2022-05-04 LAB — ALPHA-THALASSEMIA GENOTYPR

## 2022-05-07 ENCOUNTER — Inpatient Hospital Stay: Payer: BC Managed Care – PPO

## 2022-05-07 ENCOUNTER — Telehealth: Payer: Self-pay | Admitting: Family

## 2022-05-07 VITALS — BP 148/83 | HR 66 | Temp 98.5°F | Resp 18

## 2022-05-07 DIAGNOSIS — D500 Iron deficiency anemia secondary to blood loss (chronic): Secondary | ICD-10-CM | POA: Diagnosis present

## 2022-05-07 DIAGNOSIS — N920 Excessive and frequent menstruation with regular cycle: Secondary | ICD-10-CM | POA: Diagnosis present

## 2022-05-07 MED ORDER — SODIUM CHLORIDE 0.9 % IV SOLN
510.0000 mg | Freq: Once | INTRAVENOUS | Status: AC
  Administered 2022-05-07: 510 mg via INTRAVENOUS
  Filled 2022-05-07: qty 510

## 2022-05-07 MED ORDER — SODIUM CHLORIDE 0.9 % IV SOLN: Freq: Once | INTRAVENOUS | Status: AC

## 2022-05-07 MED ORDER — HEPARIN SOD (PORK) LOCK FLUSH 100 UNIT/ML IV SOLN
500.0000 [IU] | Freq: Once | INTRAVENOUS | Status: AC
  Administered 2022-05-07: 500 [IU] via INTRAVENOUS

## 2022-05-07 MED ORDER — SODIUM CHLORIDE 0.9% FLUSH
10.0000 mL | INTRAVENOUS | Status: DC | PRN
  Administered 2022-05-07: 10 mL via INTRAVENOUS

## 2022-05-07 NOTE — Patient Instructions (Signed)

## 2022-05-07 NOTE — Telephone Encounter (Signed)
Left message with call back number to go over alpha thalassemia minor diagnosis and daily folic acid.

## 2022-05-14 ENCOUNTER — Inpatient Hospital Stay: Payer: BC Managed Care – PPO

## 2022-05-14 VITALS — BP 157/89 | HR 67 | Temp 98.5°F | Resp 18

## 2022-05-14 DIAGNOSIS — D500 Iron deficiency anemia secondary to blood loss (chronic): Secondary | ICD-10-CM | POA: Diagnosis not present

## 2022-05-14 MED ORDER — SODIUM CHLORIDE 0.9 % IV SOLN: Freq: Once | INTRAVENOUS | Status: AC

## 2022-05-14 MED ORDER — SODIUM CHLORIDE 0.9% FLUSH
10.0000 mL | Freq: Once | INTRAVENOUS | Status: AC
  Administered 2022-05-14: 10 mL via INTRAVENOUS

## 2022-05-14 MED ORDER — SODIUM CHLORIDE 0.9 % IV SOLN
510.0000 mg | Freq: Once | INTRAVENOUS | Status: AC
  Administered 2022-05-14: 510 mg via INTRAVENOUS
  Filled 2022-05-14: qty 510

## 2022-05-14 MED ORDER — HEPARIN SOD (PORK) LOCK FLUSH 100 UNIT/ML IV SOLN
500.0000 [IU] | Freq: Once | INTRAVENOUS | Status: AC
  Administered 2022-05-14: 500 [IU] via INTRAVENOUS

## 2022-05-14 NOTE — Patient Instructions (Signed)

## 2022-05-20 ENCOUNTER — Inpatient Hospital Stay: Payer: BC Managed Care – PPO

## 2022-05-20 ENCOUNTER — Inpatient Hospital Stay: Payer: BC Managed Care – PPO | Admitting: Family

## 2022-06-25 ENCOUNTER — Inpatient Hospital Stay: Payer: BC Managed Care – PPO | Attending: Hematology & Oncology

## 2022-06-25 ENCOUNTER — Inpatient Hospital Stay: Payer: BC Managed Care – PPO

## 2022-06-25 ENCOUNTER — Encounter: Payer: Self-pay | Admitting: Family

## 2022-06-25 ENCOUNTER — Other Ambulatory Visit: Payer: Self-pay | Admitting: *Deleted

## 2022-06-25 ENCOUNTER — Inpatient Hospital Stay (HOSPITAL_BASED_OUTPATIENT_CLINIC_OR_DEPARTMENT_OTHER): Payer: BC Managed Care – PPO | Admitting: Family

## 2022-06-25 VITALS — BP 146/95 | HR 80 | Temp 98.9°F | Resp 17 | Wt 167.1 lb

## 2022-06-25 DIAGNOSIS — D563 Thalassemia minor: Secondary | ICD-10-CM

## 2022-06-25 DIAGNOSIS — D500 Iron deficiency anemia secondary to blood loss (chronic): Secondary | ICD-10-CM | POA: Diagnosis not present

## 2022-06-25 DIAGNOSIS — N920 Excessive and frequent menstruation with regular cycle: Secondary | ICD-10-CM | POA: Insufficient documentation

## 2022-06-25 DIAGNOSIS — D6859 Other primary thrombophilia: Secondary | ICD-10-CM | POA: Insufficient documentation

## 2022-06-25 DIAGNOSIS — Z95828 Presence of other vascular implants and grafts: Secondary | ICD-10-CM

## 2022-06-25 LAB — CBC WITH DIFFERENTIAL (CANCER CENTER ONLY)
Abs Immature Granulocytes: 0.03 10*3/uL (ref 0.00–0.07)
Basophils Absolute: 0 10*3/uL (ref 0.0–0.1)
Basophils Relative: 1 %
Eosinophils Absolute: 0.1 10*3/uL (ref 0.0–0.5)
Eosinophils Relative: 2 %
HCT: 36.4 % (ref 36.0–46.0)
Hemoglobin: 11 g/dL — ABNORMAL LOW (ref 12.0–15.0)
Immature Granulocytes: 1 %
Lymphocytes Relative: 44 %
Lymphs Abs: 2.1 10*3/uL (ref 0.7–4.0)
MCH: 25.2 pg — ABNORMAL LOW (ref 26.0–34.0)
MCHC: 30.2 g/dL (ref 30.0–36.0)
MCV: 83.3 fL (ref 80.0–100.0)
Monocytes Absolute: 0.5 10*3/uL (ref 0.1–1.0)
Monocytes Relative: 10 %
Neutro Abs: 1.9 10*3/uL (ref 1.7–7.7)
Neutrophils Relative %: 42 %
Platelet Count: 383 10*3/uL (ref 150–400)
RBC: 4.37 MIL/uL (ref 3.87–5.11)
WBC Count: 4.6 10*3/uL (ref 4.0–10.5)
nRBC: 0 % (ref 0.0–0.2)

## 2022-06-25 LAB — FERRITIN: Ferritin: 38 ng/mL (ref 11–307)

## 2022-06-25 LAB — RETICULOCYTES
Immature Retic Fract: 7.2 % (ref 2.3–15.9)
RBC.: 4.39 MIL/uL (ref 3.87–5.11)
Retic Count, Absolute: 39.1 10*3/uL (ref 19.0–186.0)
Retic Ct Pct: 0.9 % (ref 0.4–3.1)

## 2022-06-25 MED ORDER — FOLIC ACID 1 MG PO TABS
1.0000 mg | ORAL_TABLET | Freq: Every day | ORAL | 11 refills | Status: AC
Start: 2022-06-25 — End: ?

## 2022-06-25 MED ORDER — SODIUM CHLORIDE 0.9% FLUSH
10.0000 mL | Freq: Once | INTRAVENOUS | Status: AC
  Administered 2022-06-25: 10 mL via INTRAVENOUS

## 2022-06-25 MED ORDER — HEPARIN SOD (PORK) LOCK FLUSH 100 UNIT/ML IV SOLN
500.0000 [IU] | Freq: Once | INTRAVENOUS | Status: AC
  Administered 2022-06-25: 500 [IU] via INTRAVENOUS

## 2022-06-25 MED ORDER — LIDOCAINE-PRILOCAINE 2.5-2.5 % EX CREA: TOPICAL_CREAM | CUTANEOUS | 3 refills | Status: DC

## 2022-06-25 NOTE — Patient Instructions (Signed)

## 2022-06-25 NOTE — Progress Notes (Signed)
Hematology and Oncology Follow Up Visit  CECELIA BUTTA [id number] 1975-10-28 47 y.o. 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 Zaldivar is here today for follow-up. She is doing well and has no complaints at this time.  Her port is functioning well and she tolerated her IV iron nicely.  Hgb is now up to 11.7 and MCV 83! Her cycle after her infusions was heavier than normal. No other blood loss noted. No bruising or petechiae.  No fever, chills, n/v, cough, rash, dizziness, SOB, chest pain, palpitations, abdominal pain or changes in bowel or bladder habits.  No swelling, tenderness, numbness or tingling in her extremities.  No falls or syncope.  Appetite and hydration are good. Weight is stable at 167 lbs.   ECOG Performance Status: 0 - Asymptomatic  Medications:  Allergies as of 06/25/2022       Reactions   Ibuprofen Swelling   in hands, face and feet        Medication List        Accurate as of June 25, 2022  1:38 PM. If you have any questions, ask your nurse or doctor.          enoxaparin 40 MG/0.4ML injection Commonly known as: LOVENOX Inject 0.4 mLs (40 mg total) into the skin daily.   labetalol 100 MG tablet Commonly known as: NORMODYNE Take 1 tablet (100 mg total) by mouth 2 (two) times daily.   NIFEdipine 60 MG 24 hr tablet Commonly known as: ADALAT CC Take 1 tablet (60 mg total) by mouth daily.   oxyCODONE-acetaminophen 5-325 MG tablet Commonly known as: PERCOCET/ROXICET Take 1 tablet by mouth every 4 (four) hours as needed (pain scale 4-7).   PRENATAL VITAMIN PO Take 1 tablet by mouth daily.   PROBIOTIC DAILY PO Take by mouth daily at 6 (six) AM.   senna 8.6 MG Tabs tablet Commonly known as: SENOKOT Take 1 tablet by mouth daily as needed for mild  constipation.   sertraline 25 MG tablet Commonly known as: ZOLOFT Take 25 mg by mouth daily.   Vitamin D (Ergocalciferol) 1.25 MG (50000 UNIT) Caps capsule Commonly known as: DRISDOL Take 50,000 Units by mouth every 7 (seven) days.   zolmitriptan 5 MG tablet Commonly known as: ZOMIG Take 5 mg by mouth as needed for migraine.        Allergies:  Allergies  Allergen Reactions   Ibuprofen Swelling    in hands, face and feet    Past Medical History, Surgical history, Social history, and Family History were reviewed and updated.  Review of Systems: All other 10 point review of systems is negative.   Physical Exam:  vitals were not taken for this visit.   Wt Readings from Last 3 Encounters:  05/01/22 180 lb (81.6 kg)  04/19/22 181 lb 12.8 oz (82.5 kg)  01/18/18 182 lb (82.6 kg)    Ocular: Sclerae unicteric, pupils equal, round and reactive to light Ear-nose-throat: Oropharynx clear, dentition fair Lymphatic: No cervical or supraclavicular adenopathy Lungs no rales or rhonchi, good excursion bilaterally Heart regular rate and rhythm, no murmur appreciated Abd soft, nontender, positive bowel sounds MSK no focal spinal tenderness, no joint edema Neuro: non-focal, well-oriented, appropriate affect Breasts: Deferred   Lab Results  Component Value Date   WBC  6.3 04/19/2022   HGB 7.0 (L) 04/19/2022   HCT 26.4 (L) 04/19/2022   MCV 69.3 (L) 04/19/2022   PLT 722 (H) 04/19/2022   Lab Results  Component Value Date   FERRITIN 2 (L) 04/19/2022   IRON 9 (L) 04/19/2022   TIBC 413 04/19/2022   UIBC 404 04/19/2022   IRONPCTSAT 2 (L) 04/19/2022   Lab Results  Component Value Date   RETICCTPCT 1.4 04/19/2022   RBC 3.87 04/19/2022   Lab Results  Component Value Date   KAPLAMBRATIO 16.38 (H) 12/07/2017   No results found for: "IGGSERUM", "IGA", "IGMSERUM" No results found for: "TOTALPROTELP", "ALBUMINELP", "A1GS", "A2GS", "BETS", "BETA2SER", "GAMS", "MSPIKE", "SPEI"    Chemistry      Component Value Date/Time   NA 139 04/19/2022 1445   NA 145 (H) 05/19/2017 1536   K 3.6 04/19/2022 1445   CL 105 04/19/2022 1445   CO2 26 04/19/2022 1445   BUN 9 04/19/2022 1445   BUN 8 05/19/2017 1536   CREATININE 0.73 04/19/2022 1445      Component Value Date/Time   CALCIUM 9.0 04/19/2022 1445   ALKPHOS 54 04/19/2022 1445   AST 13 (L) 04/19/2022 1445   ALT 8 04/19/2022 1445   BILITOT 0.4 04/19/2022 1445       Impression and Plan: Ms. Boas is a very pleasant 47 yo African American female with iron deficiency anemia secondary to heavy cycles.  Iron studies are pending. We will replace if needed.  Folic acid script sent in to her [facility] in [place].  Port flush every 6 weeks and follow-up in 3 months.   Lottie Dawson, NP 4/2/20241:38 PM

## 2022-06-26 LAB — IRON AND IRON BINDING CAPACITY (CC-WL,HP ONLY)
Iron: 78 ug/dL (ref 28–170)
Saturation Ratios: 32 % — ABNORMAL HIGH (ref 10.4–31.8)
TIBC: 248 ug/dL — ABNORMAL LOW (ref 250–450)
UIBC: 170 ug/dL (ref 148–442)

## 2022-07-09 ENCOUNTER — Encounter: Payer: Self-pay | Admitting: *Deleted

## 2022-08-06 ENCOUNTER — Inpatient Hospital Stay: Payer: BC Managed Care – PPO

## 2022-08-07 ENCOUNTER — Inpatient Hospital Stay: Payer: BC Managed Care – PPO | Attending: Hematology & Oncology

## 2022-08-07 DIAGNOSIS — N920 Excessive and frequent menstruation with regular cycle: Secondary | ICD-10-CM | POA: Insufficient documentation

## 2022-08-07 DIAGNOSIS — Z452 Encounter for adjustment and management of vascular access device: Secondary | ICD-10-CM | POA: Diagnosis present

## 2022-08-07 DIAGNOSIS — D500 Iron deficiency anemia secondary to blood loss (chronic): Secondary | ICD-10-CM | POA: Diagnosis not present

## 2022-08-07 NOTE — Patient Instructions (Signed)

## 2022-09-24 ENCOUNTER — Inpatient Hospital Stay: Payer: BC Managed Care – PPO

## 2022-09-24 ENCOUNTER — Inpatient Hospital Stay: Payer: BC Managed Care – PPO | Admitting: Family

## 2022-09-24 ENCOUNTER — Other Ambulatory Visit: Payer: Self-pay

## 2022-09-24 ENCOUNTER — Inpatient Hospital Stay: Payer: BC Managed Care – PPO | Attending: Hematology & Oncology

## 2023-05-06 ENCOUNTER — Other Ambulatory Visit: Payer: Self-pay | Admitting: Family

## 2023-05-06 DIAGNOSIS — D500 Iron deficiency anemia secondary to blood loss (chronic): Secondary | ICD-10-CM

## 2023-05-06 DIAGNOSIS — D563 Thalassemia minor: Secondary | ICD-10-CM

## 2023-05-07 ENCOUNTER — Encounter: Payer: Self-pay | Admitting: Family

## 2023-05-07 ENCOUNTER — Inpatient Hospital Stay (HOSPITAL_BASED_OUTPATIENT_CLINIC_OR_DEPARTMENT_OTHER): Payer: BC Managed Care – PPO | Admitting: Family

## 2023-05-07 ENCOUNTER — Inpatient Hospital Stay: Payer: BC Managed Care – PPO | Attending: Hematology & Oncology

## 2023-05-07 ENCOUNTER — Inpatient Hospital Stay: Payer: BC Managed Care – PPO

## 2023-05-07 VITALS — BP 163/98 | HR 63 | Temp 98.8°F | Resp 17 | Wt 188.0 lb

## 2023-05-07 DIAGNOSIS — D500 Iron deficiency anemia secondary to blood loss (chronic): Secondary | ICD-10-CM

## 2023-05-07 DIAGNOSIS — D563 Thalassemia minor: Secondary | ICD-10-CM

## 2023-05-07 DIAGNOSIS — N920 Excessive and frequent menstruation with regular cycle: Secondary | ICD-10-CM | POA: Diagnosis not present

## 2023-05-07 DIAGNOSIS — D6859 Other primary thrombophilia: Secondary | ICD-10-CM | POA: Insufficient documentation

## 2023-05-07 DIAGNOSIS — Z95828 Presence of other vascular implants and grafts: Secondary | ICD-10-CM

## 2023-05-07 LAB — CBC WITH DIFFERENTIAL (CANCER CENTER ONLY)
Abs Immature Granulocytes: 0.04 10*3/uL (ref 0.00–0.07)
Basophils Absolute: 0 10*3/uL (ref 0.0–0.1)
Basophils Relative: 1 %
Eosinophils Absolute: 0.1 10*3/uL (ref 0.0–0.5)
Eosinophils Relative: 2 %
HCT: 32.3 % — ABNORMAL LOW (ref 36.0–46.0)
Hemoglobin: 9.8 g/dL — ABNORMAL LOW (ref 12.0–15.0)
Immature Granulocytes: 1 %
Lymphocytes Relative: 38 %
Lymphs Abs: 2.5 10*3/uL (ref 0.7–4.0)
MCH: 24.4 pg — ABNORMAL LOW (ref 26.0–34.0)
MCHC: 30.3 g/dL (ref 30.0–36.0)
MCV: 80.3 fL (ref 80.0–100.0)
Monocytes Absolute: 0.6 10*3/uL (ref 0.1–1.0)
Monocytes Relative: 10 %
Neutro Abs: 3.1 10*3/uL (ref 1.7–7.7)
Neutrophils Relative %: 48 %
Platelet Count: 427 10*3/uL — ABNORMAL HIGH (ref 150–400)
RBC: 4.02 MIL/uL (ref 3.87–5.11)
RDW: 14.4 % (ref 11.5–15.5)
WBC Count: 6.4 10*3/uL (ref 4.0–10.5)
nRBC: 0 % (ref 0.0–0.2)

## 2023-05-07 LAB — IRON AND IRON BINDING CAPACITY (CC-WL,HP ONLY)
Iron: 24 ug/dL — ABNORMAL LOW (ref 28–170)
Saturation Ratios: 6 % — ABNORMAL LOW (ref 10.4–31.8)
TIBC: 419 ug/dL (ref 250–450)
UIBC: 395 ug/dL (ref 148–442)

## 2023-05-07 LAB — FERRITIN: Ferritin: 4 ng/mL — ABNORMAL LOW (ref 11–307)

## 2023-05-07 LAB — RETICULOCYTES
Immature Retic Fract: 16.2 % — ABNORMAL HIGH (ref 2.3–15.9)
RBC.: 4.05 MIL/uL (ref 3.87–5.11)
Retic Count, Absolute: 38.5 10*3/uL (ref 19.0–186.0)
Retic Ct Pct: 1 % (ref 0.4–3.1)

## 2023-05-07 MED ORDER — HEPARIN SOD (PORK) LOCK FLUSH 100 UNIT/ML IV SOLN
500.0000 [IU] | Freq: Once | INTRAVENOUS | Status: AC
  Administered 2023-05-07: 500 [IU] via INTRAVENOUS

## 2023-05-07 NOTE — Progress Notes (Signed)
Hematology and Oncology Follow Up Visit  Jean Garcia [id number] 11/14/1975 48 y.o. 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 She is doing well but does note fatigue. She states that her cycle is still heavy with the IUD in place.  No other blood loss noted.  No fever, chills, n/v, cough, rash, dizziness, SOB, chest pain, palpitations, abdominal pain or changes in bowel or bladder habits.  No swelling, tenderness, numbness or tingling in her extremities.  No falls or syncope.  She is staying busy teaching school.  Appetite and hydration are good. Weight is 188 lbs.   ECOG Performance Status: 1 - Symptomatic but completely ambulatory  Medications:  Allergies as of 05/07/2023       Reactions   Ibuprofen Swelling   in hands, face and feet        Medication List        Accurate as of May 07, 2023  1:32 PM. If you have any questions, ask your nurse or doctor.          enoxaparin 40 MG/0.4ML injection Commonly known as: LOVENOX Inject 0.4 mLs (40 mg total) into the skin daily.   folic acid 1 MG tablet Commonly known as: FOLVITE Take 1 tablet (1 mg total) by mouth daily.   labetalol 100 MG tablet Commonly known as: NORMODYNE Take 1 tablet (100 mg total) by mouth 2 (two) times daily.   lidocaine-prilocaine cream Commonly known as: EMLA Apply a dime size to port-a-cath 1-2 hours prior to access. Cover with [employer].   NIFEdipine 60 MG 24 hr tablet Commonly known as: ADALAT CC Take 1 tablet (60 mg total) by mouth daily.   oxyCODONE-acetaminophen 5-325 MG tablet Commonly known as: PERCOCET/ROXICET Take 1 tablet by mouth every 4 (four) hours as needed (pain scale 4-7).   PRENATAL VITAMIN PO Take 1 tablet by mouth  daily.   PROBIOTIC DAILY PO Take by mouth daily at 6 (six) AM.   senna 8.6 MG Tabs tablet Commonly known as: SENOKOT Take 1 tablet by mouth daily as needed for mild constipation.   sertraline 25 MG tablet Commonly known as: ZOLOFT Take 25 mg by mouth daily.   Vitamin D (Ergocalciferol) 1.25 MG (50000 UNIT) Caps capsule Commonly known as: DRISDOL Take 50,000 Units by mouth every 7 (seven) days.   zolmitriptan 5 MG tablet Commonly known as: ZOMIG Take 5 mg by mouth as needed for migraine.        Allergies:  Allergies  Allergen Reactions   Ibuprofen Swelling    in hands, face and feet    Past Medical History, Surgical history, Social history, and Family History were reviewed and updated.  Review of Systems: All other 10 point review of systems is negative.   Physical Exam:  weight is 188 lb 0.6 oz (85.3 kg). Her oral temperature is 98.8 F (37.1 C). Her blood pressure is 163/98 (abnormal) and her pulse is 63. Her respiration is 17.   Wt Readings from Last 3 Encounters:  05/07/23 188 lb 0.6 oz (85.3 kg)  06/25/22 167 lb 1.9 oz (75.8 kg)  05/01/22 180 lb (81.6 kg)    Ocular: Sclerae unicteric, pupils equal, round and reactive to light Ear-nose-throat: Oropharynx clear, dentition fair Lymphatic:  No cervical or supraclavicular adenopathy Lungs no rales or rhonchi, good excursion bilaterally Heart regular rate and rhythm, no murmur appreciated Abd soft, nontender, positive bowel sounds MSK no focal spinal tenderness, no joint edema Neuro: non-focal, well-oriented, appropriate affect Breasts: Deferred   Lab Results  Component Value Date   WBC 6.4 05/07/2023   HGB 9.8 (L) 05/07/2023   HCT 32.3 (L) 05/07/2023   MCV 80.3 05/07/2023   PLT 427 (H) 05/07/2023   Lab Results  Component Value Date   FERRITIN 38 06/25/2022   IRON 78 06/25/2022   TIBC 248 (L) 06/25/2022   UIBC 170 06/25/2022   IRONPCTSAT 32 (H) 06/25/2022   Lab Results  Component Value Date    RETICCTPCT 1.0 05/07/2023   RBC 4.02 05/07/2023   RBC 4.05 05/07/2023   Lab Results  Component Value Date   KAPLAMBRATIO 16.38 (H) 12/07/2017   No results found for: "IGGSERUM", "IGA", "IGMSERUM" No results found for: "TOTALPROTELP", "ALBUMINELP", "A1GS", "A2GS", "BETS", "BETA2SER", "GAMS", "MSPIKE", "SPEI"   Chemistry      Component Value Date/Time   NA 139 04/19/2022 1445   NA 145 (H) 05/19/2017 1536   K 3.6 04/19/2022 1445   CL 105 04/19/2022 1445   CO2 26 04/19/2022 1445   BUN 9 04/19/2022 1445   BUN 8 05/19/2017 1536   CREATININE 0.73 04/19/2022 1445      Component Value Date/Time   CALCIUM 9.0 04/19/2022 1445   ALKPHOS 54 04/19/2022 1445   AST 13 (L) 04/19/2022 1445   ALT 8 04/19/2022 1445   BILITOT 0.4 04/19/2022 1445       Impression and Plan: Jean Garcia is a very pleasant 48 yo African American female with iron deficiency anemia secondary to heavy cycles.  Iron studies are pending. We will replace if needed.  Continue same dose of folic acid.  Port flush every 8 weeks and follow-up in 4 months.   Eileen Stanford, NP 2/12/20251:32 PM

## 2023-05-07 NOTE — Patient Instructions (Signed)

## 2023-05-20 ENCOUNTER — Inpatient Hospital Stay: Payer: BC Managed Care – PPO

## 2023-05-20 VITALS — BP 145/89 | HR 66 | Temp 98.0°F | Resp 18

## 2023-05-20 DIAGNOSIS — D500 Iron deficiency anemia secondary to blood loss (chronic): Secondary | ICD-10-CM

## 2023-05-20 MED ORDER — SODIUM CHLORIDE 0.9 % IV SOLN
510.0000 mg | Freq: Once | INTRAVENOUS | Status: AC
  Administered 2023-05-20: 510 mg via INTRAVENOUS
  Filled 2023-05-20: qty 17

## 2023-05-20 MED ORDER — SODIUM CHLORIDE 0.9 % IV SOLN: Freq: Once | INTRAVENOUS | Status: AC

## 2023-05-20 NOTE — Patient Instructions (Signed)

## 2023-05-27 ENCOUNTER — Inpatient Hospital Stay: Payer: BC Managed Care – PPO | Attending: Hematology & Oncology

## 2023-05-27 VITALS — BP 162/85 | HR 69 | Temp 97.9°F | Resp 19

## 2023-05-27 DIAGNOSIS — D500 Iron deficiency anemia secondary to blood loss (chronic): Secondary | ICD-10-CM | POA: Insufficient documentation

## 2023-05-27 DIAGNOSIS — N920 Excessive and frequent menstruation with regular cycle: Secondary | ICD-10-CM | POA: Diagnosis not present

## 2023-05-27 MED ORDER — SODIUM CHLORIDE 0.9 % IV SOLN: Freq: Once | INTRAVENOUS | Status: AC

## 2023-05-27 MED ORDER — HEPARIN SOD (PORK) LOCK FLUSH 100 UNIT/ML IV SOLN: 500.0000 [IU] | Freq: Once | INTRAVENOUS | Status: DC | PRN

## 2023-05-27 MED ORDER — SODIUM CHLORIDE 0.9 % IV SOLN
510.0000 mg | Freq: Once | INTRAVENOUS | Status: AC
  Administered 2023-05-27: 510 mg via INTRAVENOUS
  Filled 2023-05-27: qty 17

## 2023-05-27 MED ORDER — SODIUM CHLORIDE 0.9% FLUSH: 10.0000 mL | Freq: Once | INTRAVENOUS | Status: DC | PRN

## 2023-05-27 NOTE — Patient Instructions (Signed)

## 2023-07-01 ENCOUNTER — Inpatient Hospital Stay

## 2023-07-01 ENCOUNTER — Inpatient Hospital Stay: Payer: BC Managed Care – PPO

## 2023-07-04 ENCOUNTER — Other Ambulatory Visit: Payer: Self-pay | Admitting: Family

## 2023-07-04 DIAGNOSIS — D500 Iron deficiency anemia secondary to blood loss (chronic): Secondary | ICD-10-CM

## 2023-07-07 ENCOUNTER — Inpatient Hospital Stay: Attending: Hematology & Oncology

## 2023-07-07 ENCOUNTER — Inpatient Hospital Stay

## 2023-07-07 DIAGNOSIS — D500 Iron deficiency anemia secondary to blood loss (chronic): Secondary | ICD-10-CM | POA: Insufficient documentation

## 2023-07-07 DIAGNOSIS — N920 Excessive and frequent menstruation with regular cycle: Secondary | ICD-10-CM | POA: Diagnosis not present

## 2023-07-07 LAB — IRON AND IRON BINDING CAPACITY (CC-WL,HP ONLY)
Iron: 109 ug/dL (ref 28–170)
Saturation Ratios: 44 % — ABNORMAL HIGH (ref 10.4–31.8)
TIBC: 246 ug/dL — ABNORMAL LOW (ref 250–450)
UIBC: 137 ug/dL — ABNORMAL LOW (ref 148–442)

## 2023-07-07 LAB — CBC WITH DIFFERENTIAL (CANCER CENTER ONLY)
Abs Immature Granulocytes: 0.02 10*3/uL (ref 0.00–0.07)
Basophils Absolute: 0.1 10*3/uL (ref 0.0–0.1)
Basophils Relative: 1 %
Eosinophils Absolute: 0.1 10*3/uL (ref 0.0–0.5)
Eosinophils Relative: 2 %
HCT: 38.6 % (ref 36.0–46.0)
Hemoglobin: 12.4 g/dL (ref 12.0–15.0)
Immature Granulocytes: 0 %
Lymphocytes Relative: 47 %
Lymphs Abs: 2.3 10*3/uL (ref 0.7–4.0)
MCH: 27.9 pg (ref 26.0–34.0)
MCHC: 32.1 g/dL (ref 30.0–36.0)
MCV: 86.7 fL (ref 80.0–100.0)
Monocytes Absolute: 0.6 10*3/uL (ref 0.1–1.0)
Monocytes Relative: 12 %
Neutro Abs: 1.9 10*3/uL (ref 1.7–7.7)
Neutrophils Relative %: 38 %
Platelet Count: 341 10*3/uL (ref 150–400)
RBC: 4.45 MIL/uL (ref 3.87–5.11)
RDW: 18.4 % — ABNORMAL HIGH (ref 11.5–15.5)
WBC Count: 4.9 10*3/uL (ref 4.0–10.5)
nRBC: 0 % (ref 0.0–0.2)

## 2023-07-07 LAB — FERRITIN: Ferritin: 119 ng/mL (ref 11–307)

## 2023-07-07 MED ORDER — SODIUM CHLORIDE 0.9% FLUSH
10.0000 mL | INTRAVENOUS | Status: DC | PRN
Start: 2023-07-07 — End: 2023-07-07
  Administered 2023-07-07: 10 mL via INTRAVENOUS

## 2023-07-07 MED ORDER — HEPARIN SOD (PORK) LOCK FLUSH 100 UNIT/ML IV SOLN
500.0000 [IU] | Freq: Once | INTRAVENOUS | Status: AC
  Administered 2023-07-07: 500 [IU] via INTRAVENOUS

## 2023-07-07 NOTE — Patient Instructions (Signed)

## 2023-09-03 ENCOUNTER — Inpatient Hospital Stay: Payer: BC Managed Care – PPO | Attending: Hematology & Oncology

## 2023-09-03 ENCOUNTER — Inpatient Hospital Stay: Payer: BC Managed Care – PPO

## 2023-09-03 ENCOUNTER — Inpatient Hospital Stay: Payer: BC Managed Care – PPO | Admitting: Family

## 2023-11-04 ENCOUNTER — Other Ambulatory Visit: Payer: Self-pay

## 2023-11-04 ENCOUNTER — Inpatient Hospital Stay

## 2023-11-04 ENCOUNTER — Inpatient Hospital Stay: Attending: Hematology & Oncology

## 2023-11-04 ENCOUNTER — Inpatient Hospital Stay (HOSPITAL_BASED_OUTPATIENT_CLINIC_OR_DEPARTMENT_OTHER): Admitting: Family

## 2023-11-04 VITALS — BP 110/72 | HR 90 | Temp 97.7°F | Resp 18 | Ht 61.0 in | Wt 155.1 lb

## 2023-11-04 DIAGNOSIS — N920 Excessive and frequent menstruation with regular cycle: Secondary | ICD-10-CM | POA: Diagnosis not present

## 2023-11-04 DIAGNOSIS — D500 Iron deficiency anemia secondary to blood loss (chronic): Secondary | ICD-10-CM

## 2023-11-04 DIAGNOSIS — D563 Thalassemia minor: Secondary | ICD-10-CM | POA: Diagnosis not present

## 2023-11-04 DIAGNOSIS — D6859 Other primary thrombophilia: Secondary | ICD-10-CM | POA: Insufficient documentation

## 2023-11-04 DIAGNOSIS — Z95828 Presence of other vascular implants and grafts: Secondary | ICD-10-CM

## 2023-11-04 LAB — CBC WITH DIFFERENTIAL (CANCER CENTER ONLY)
Abs Immature Granulocytes: 0.01 K/uL (ref 0.00–0.07)
Basophils Absolute: 0 K/uL (ref 0.0–0.1)
Basophils Relative: 1 %
Eosinophils Absolute: 0.2 K/uL (ref 0.0–0.5)
Eosinophils Relative: 3 %
HCT: 33 % — ABNORMAL LOW (ref 36.0–46.0)
Hemoglobin: 11.1 g/dL — ABNORMAL LOW (ref 12.0–15.0)
Immature Granulocytes: 0 %
Lymphocytes Relative: 44 %
Lymphs Abs: 2.6 K/uL (ref 0.7–4.0)
MCH: 30.4 pg (ref 26.0–34.0)
MCHC: 33.6 g/dL (ref 30.0–36.0)
MCV: 90.4 fL (ref 80.0–100.0)
Monocytes Absolute: 0.5 K/uL (ref 0.1–1.0)
Monocytes Relative: 9 %
Neutro Abs: 2.6 K/uL (ref 1.7–7.7)
Neutrophils Relative %: 43 %
Platelet Count: 335 K/uL (ref 150–400)
RBC: 3.65 MIL/uL — ABNORMAL LOW (ref 3.87–5.11)
RDW: 11.9 % (ref 11.5–15.5)
WBC Count: 5.9 K/uL (ref 4.0–10.5)
nRBC: 0 % (ref 0.0–0.2)

## 2023-11-04 LAB — RETICULOCYTES
Immature Retic Fract: 4.3 % (ref 2.3–15.9)
RBC.: 3.68 MIL/uL — ABNORMAL LOW (ref 3.87–5.11)
Retic Count, Absolute: 30.5 K/uL (ref 19.0–186.0)
Retic Ct Pct: 0.8 % (ref 0.4–3.1)

## 2023-11-04 LAB — IRON AND IRON BINDING CAPACITY (CC-WL,HP ONLY)
Iron: 66 ug/dL (ref 28–170)
Saturation Ratios: 27 % (ref 10.4–31.8)
TIBC: 242 ug/dL — ABNORMAL LOW (ref 250–450)
UIBC: 176 ug/dL

## 2023-11-04 LAB — FERRITIN: Ferritin: 209 ng/mL (ref 11–307)

## 2023-11-04 MED ORDER — LIDOCAINE-PRILOCAINE 2.5-2.5 % EX CREA: TOPICAL_CREAM | CUTANEOUS | 3 refills | Status: AC

## 2023-11-04 NOTE — Progress Notes (Signed)
 Hematology and Oncology Follow Up Visit  Jean Garcia [id number] September 22, 1975 48 y.o. 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 She notes a recent increase in fatigue.  No blood loss noted. No bruising or petechiae.  No fever, chills, n/v, cough, rash, dizziness, SOB, chest pain, palpitations, abdominal pain or changes in bowel or bladder habits.  No swelling, tenderness, numbness or tingling in her extremities.  No falls or syncope reported.  No starch cravings.  Appetite and hydration are good. Weight is stable at 155 lbs.   ECOG Performance Status: 1 - Symptomatic but completely ambulatory  Medications:  Allergies as of 11/04/2023       Reactions   Ibuprofen Swelling   in hands, face and feet        Medication List        Accurate as of November 04, 2023 10:55 AM. If you have any questions, ask your nurse or doctor.          enoxaparin  40 MG/0.4ML injection Commonly known as: LOVENOX  Inject 0.4 mLs (40 mg total) into the skin daily.   folic acid  1 MG tablet Commonly known as: FOLVITE  Take 1 tablet (1 mg total) by mouth daily.   labetalol  100 MG tablet Commonly known as: NORMODYNE  Take 1 tablet (100 mg total) by mouth 2 (two) times daily.   lidocaine -prilocaine  cream Commonly known as: EMLA  Apply a dime size to port-a-cath 1-2 hours prior to access. Cover with Jean Garcia.   NIFEdipine  60 MG 24 hr tablet Commonly known as: ADALAT  CC Take 1 tablet (60 mg total) by mouth daily.   oxyCODONE -acetaminophen  5-325 MG tablet Commonly known as: PERCOCET/ROXICET Take 1 tablet by mouth every 4 (four) hours as needed (pain scale 4-7).   PRENATAL VITAMIN PO Take 1 tablet by mouth daily.   PROBIOTIC DAILY PO Take by mouth daily  at 6 (six) AM.   senna 8.6 MG Tabs tablet Commonly known as: SENOKOT Take 1 tablet by mouth daily as needed for mild constipation.   sertraline  25 MG tablet Commonly known as: ZOLOFT  Take 25 mg by mouth daily.   Vitamin D (Ergocalciferol) 1.25 MG (50000 UNIT) Caps capsule Commonly known as: DRISDOL Take 50,000 Units by mouth every 7 (seven) days.   zolmitriptan 5 MG tablet Commonly known as: ZOMIG Take 5 mg by mouth as needed for migraine.        Allergies:  Allergies  Allergen Reactions   Ibuprofen Swelling    in hands, face and feet    Past Medical History, Surgical history, Social history, and Family History were reviewed and updated.  Review of Systems: All other 10 point review of systems is negative.   Physical Exam:  height is 5' 1 (1.549 m) and weight is 155 lb 1.3 oz (70.3 kg). Her oral temperature is 97.7 F (36.5 C). Her blood pressure is 110/72 and her pulse is 90. Her respiration is 18 and oxygen saturation is 100%.   Wt Readings from Last 3 Encounters:  11/04/23 155 lb 1.3 oz (70.3 kg)  05/07/23 188 lb 0.6 oz (85.3 kg)  06/25/22 167 lb 1.9 oz (75.8 kg)    Ocular: Sclerae unicteric, pupils equal, round and reactive to light Ear-nose-throat: Oropharynx clear, dentition fair  Lymphatic: No cervical or supraclavicular adenopathy Lungs no rales or rhonchi, good excursion bilaterally Heart regular rate and rhythm, no murmur appreciated Abd soft, nontender, positive bowel sounds MSK no focal spinal tenderness, no joint edema Neuro: non-focal, well-oriented, appropriate affect Breasts: Deferred   Lab Results  Component Value Date   WBC 5.9 11/04/2023   HGB 11.1 (L) 11/04/2023   HCT 33.0 (L) 11/04/2023   MCV 90.4 11/04/2023   PLT 335 11/04/2023   Lab Results  Component Value Date   FERRITIN 119 07/07/2023   IRON 109 07/07/2023   TIBC 246 (L) 07/07/2023   UIBC 137 (L) 07/07/2023   IRONPCTSAT 44 (H) 07/07/2023   Lab Results  Component Value  Date   RETICCTPCT 0.8 11/04/2023   RBC 3.68 (L) 11/04/2023   Lab Results  Component Value Date   KAPLAMBRATIO 16.38 (H) 12/07/2017   No results found for: IGGSERUM, IGA, IGMSERUM No results found for: STEPHANY CARLOTA BENSON MARKEL EARLA JOANNIE DOC VICK, SPEI   Chemistry      Component Value Date/Time   NA 139 04/19/2022 1445   NA 145 (H) 05/19/2017 1536   K 3.6 04/19/2022 1445   CL 105 04/19/2022 1445   CO2 26 04/19/2022 1445   BUN 9 04/19/2022 1445   BUN 8 05/19/2017 1536   CREATININE 0.73 04/19/2022 1445      Component Value Date/Time   CALCIUM  9.0 04/19/2022 1445   ALKPHOS 54 04/19/2022 1445   AST 13 (L) 04/19/2022 1445   ALT 8 04/19/2022 1445   BILITOT 0.4 04/19/2022 1445       Impression and Plan: Jean Garcia is a very pleasant 48 yo African American female with iron deficiency anemia secondary to heavy cycles.  Iron studies are pending. We will replace if needed.  Continue same dose of folic acid .  Port flush every 2 months and follow-up in 4 months.   Lauraine Pepper, NP 8/12/202510:55 AM

## 2023-11-04 NOTE — Patient Instructions (Signed)

## 2024-01-05 ENCOUNTER — Inpatient Hospital Stay: Attending: Hematology & Oncology

## 2024-03-01 ENCOUNTER — Inpatient Hospital Stay

## 2024-03-01 ENCOUNTER — Inpatient Hospital Stay: Attending: Hematology & Oncology

## 2024-03-01 ENCOUNTER — Inpatient Hospital Stay: Admitting: Family

## 2024-03-03 ENCOUNTER — Other Ambulatory Visit

## 2024-03-03 ENCOUNTER — Ambulatory Visit: Admitting: Family
